# Patient Record
Sex: Male | Born: 1940 | Race: White | Hispanic: No | Marital: Married | Smoking: Never smoker
Health system: Southern US, Community
[De-identification: ages and names within clinical notes are randomized; demographics above are authoritative.]

## PROBLEM LIST (undated history)

## (undated) DIAGNOSIS — C9590 Leukemia, unspecified not having achieved remission: Secondary | ICD-10-CM

## (undated) DIAGNOSIS — K219 Gastro-esophageal reflux disease without esophagitis: Secondary | ICD-10-CM

## (undated) DIAGNOSIS — R002 Palpitations: Secondary | ICD-10-CM

## (undated) DIAGNOSIS — N289 Disorder of kidney and ureter, unspecified: Secondary | ICD-10-CM

## (undated) DIAGNOSIS — M359 Systemic involvement of connective tissue, unspecified: Secondary | ICD-10-CM

## (undated) DIAGNOSIS — I10 Essential (primary) hypertension: Secondary | ICD-10-CM

## (undated) DIAGNOSIS — E119 Type 2 diabetes mellitus without complications: Secondary | ICD-10-CM

## (undated) DIAGNOSIS — R079 Chest pain, unspecified: Secondary | ICD-10-CM

## (undated) DIAGNOSIS — J45909 Unspecified asthma, uncomplicated: Secondary | ICD-10-CM

## (undated) DIAGNOSIS — R0600 Dyspnea, unspecified: Secondary | ICD-10-CM

## (undated) DIAGNOSIS — T8859XA Other complications of anesthesia, initial encounter: Secondary | ICD-10-CM

## (undated) DIAGNOSIS — I2510 Atherosclerotic heart disease of native coronary artery without angina pectoris: Secondary | ICD-10-CM

## (undated) DIAGNOSIS — T4145XA Adverse effect of unspecified anesthetic, initial encounter: Secondary | ICD-10-CM

## (undated) DIAGNOSIS — J449 Chronic obstructive pulmonary disease, unspecified: Secondary | ICD-10-CM

## (undated) HISTORY — DX: Gastro-esophageal reflux disease without esophagitis: K21.9

## (undated) HISTORY — DX: Palpitations: R00.2

## (undated) HISTORY — DX: Systemic involvement of connective tissue, unspecified: M35.9

## (undated) HISTORY — DX: Dyspnea, unspecified: R06.00

## (undated) HISTORY — DX: Atherosclerotic heart disease of native coronary artery without angina pectoris: I25.10

## (undated) HISTORY — PX: CATARACT EXTRACTION: SUR2

## (undated) HISTORY — PX: CARDIAC CATHETERIZATION: SHX172

## (undated) HISTORY — DX: Type 2 diabetes mellitus without complications: E11.9

## (undated) HISTORY — DX: Chest pain, unspecified: R07.9

---

## 1969-10-13 HISTORY — PX: TONSILLECTOMY: SUR1361

## 1985-10-13 HISTORY — PX: APPENDECTOMY: SHX54

## 1987-10-14 HISTORY — PX: GALLBLADDER SURGERY: SHX652

## 1987-11-21 HISTORY — PX: NASAL SINUS SURGERY: SHX719

## 1988-10-13 HISTORY — PX: OTHER SURGICAL HISTORY: SHX169

## 2004-06-07 HISTORY — PX: OTHER SURGICAL HISTORY: SHX169

## 2005-07-21 ENCOUNTER — Inpatient Hospital Stay (HOSPITAL_COMMUNITY): Admission: RE | Admit: 2005-07-21 | Discharge: 2005-07-22 | Payer: Self-pay | Admitting: Neurosurgery

## 2005-10-13 HISTORY — PX: SHOULDER SURGERY: SHX246

## 2006-06-26 ENCOUNTER — Ambulatory Visit (HOSPITAL_COMMUNITY): Admission: RE | Admit: 2006-06-26 | Discharge: 2006-06-26 | Payer: Self-pay | Admitting: Orthopedic Surgery

## 2018-03-12 ENCOUNTER — Encounter: Payer: Self-pay | Admitting: *Deleted

## 2018-03-15 ENCOUNTER — Ambulatory Visit: Payer: Medicare Other | Admitting: Cardiovascular Disease

## 2018-03-15 ENCOUNTER — Encounter: Payer: Self-pay | Admitting: Cardiovascular Disease

## 2018-03-15 ENCOUNTER — Telehealth: Payer: Self-pay | Admitting: Cardiovascular Disease

## 2018-03-15 ENCOUNTER — Encounter: Payer: Self-pay | Admitting: *Deleted

## 2018-03-15 VITALS — BP 130/64 | HR 82 | Ht 67.0 in | Wt 173.0 lb

## 2018-03-15 DIAGNOSIS — K219 Gastro-esophageal reflux disease without esophagitis: Secondary | ICD-10-CM

## 2018-03-15 DIAGNOSIS — I959 Hypotension, unspecified: Secondary | ICD-10-CM | POA: Diagnosis not present

## 2018-03-15 DIAGNOSIS — I25118 Atherosclerotic heart disease of native coronary artery with other forms of angina pectoris: Secondary | ICD-10-CM | POA: Diagnosis not present

## 2018-03-15 DIAGNOSIS — I10 Essential (primary) hypertension: Secondary | ICD-10-CM

## 2018-03-15 DIAGNOSIS — E785 Hyperlipidemia, unspecified: Secondary | ICD-10-CM | POA: Diagnosis not present

## 2018-03-15 DIAGNOSIS — R011 Cardiac murmur, unspecified: Secondary | ICD-10-CM

## 2018-03-15 NOTE — Telephone Encounter (Signed)
*    Echo - cardiac murmur scheduled in [facility] April 07, 2018   *Exercise myoview -hypotension scheduled at [facility] April 08, 2018

## 2018-03-15 NOTE — Progress Notes (Signed)
CARDIOLOGY CONSULT NOTE  Patient ID: BRIAR WITHERSPOON MRN: [id number] DOB/AGE: 75/13/1942 75 y.o.  Admit date: (Not on file) Primary Physician: Caryl Bis, MD Referring Physician: Caryl Bis, MD  Reason for Consultation: Coronary artery disease  HPI: Lonnie Duffy is a 77 y.o. male who is being seen today for the evaluation of coronary artery disease at the request of Dr. Gar Ponto.   He has a history of coronary artery disease and [employer] Taxus drug-eluting stent placement to the LAD on 06/07/2004.  Other medical history includes GERD, dyslipidemia, rheumatoid arthritis, hypertension, and chronic reactive airway disease.  He has been followed in [place], [place].  I reviewed records from his PCP.  Back in June 2017, he reportedly wore a 24-hour Holter monitor for evaluation of palpitations.  There was evidence of infrequent supraventricular and ventricular ectopic activity with no evidence of SVT.  He underwent an echocardiogram at that time which demonstrated normal left ventricular systolic function, LVEF 09%, with LVH and minimal mitral and tricuspid regurgitation.  I personally reviewed an ECG performed on 04/01/2017 which demonstrated sinus rhythm with nonspecific ST segment abnormalities in the inferolateral leads.  I personally reviewed an ECG performed in our office today which also demonstrates sinus rhythm with nonspecific ST segment and T wave abnormalities in the inferolateral leads.  I reviewed labs performed on 02/25/2018: Hemoglobin 12.9, platelets 328, BUN 15, creatinine 0.99, A1c 5.9%, total cholesterol 132, triglycerides 80, HDL 47, LDL 69.  He said he has never used nitroglycerin.  He had symptoms of chest pain and indigestion prior to undergoing LAD stent placement.  Since that time he denies chest pain and shortness of breath.  He denies palpitations, orthopnea, and leg swelling.  He has noticed that if he heavily exerts himself  outdoors on hot days, his blood pressure drops to the 80/40 range.  He initially thought it was due to low blood sugar but has checked it at times and found to be normal.  He denies chest pain and dyspnea during these episodes.  He also has occasional dizzy spells when climbing up on a ladder and tries to work on the ground.  He works with his son.  He had been on Plavix in the past and it exacerbated GERD symptoms.       Allergies  Allergen Reactions  . Flagyl [Metronidazole]   . Penicillins     Current Outpatient Medications  Medication Sig Dispense Refill  . aspirin EC 81 MG tablet Take 81 mg by mouth daily.    Marland Kitchen atorvastatin (LIPITOR) 10 MG tablet Take 10 mg by mouth daily.    . Calcium-Magnesium-Vitamin D (CALCIUM 500 PO) Take 1 tablet by mouth 2 (two) times daily.    . Cholecalciferol (VITAMIN D3) 5000 units TABS Take 1 tablet by mouth daily.    . cyclobenzaprine (FLEXERIL) 10 MG tablet Take 10 mg by mouth daily as needed for muscle spasms.    . fluticasone (FLONASE) 50 MCG/ACT nasal spray Place 2 sprays into both nostrils daily.    . folic acid (FOLVITE) 1 MG tablet Take 1 mg by mouth daily.    Marland Kitchen gabapentin (NEURONTIN) 800 MG tablet Take 800 mg by mouth 2 (two) times daily.    Marland Kitchen losartan-hydrochlorothiazide (HYZAAR) 100-12.5 MG tablet Take 0.5 tablets by mouth daily.    . methotrexate 2.5 MG tablet Take 20 mg by mouth once a week. (8 tabs)    . nitroGLYCERIN (NITROSTAT) 0.4  MG SL tablet Place 0.4 mg under the tongue every 5 (five) minutes as needed for chest pain.    . Omega-3 Fatty Acids (FISH OIL) 1000 MG CAPS Take 4 capsules by mouth daily.    . pantoprazole (PROTONIX) 40 MG tablet Take 40 mg by mouth daily.    . polyethylene glycol (MIRALAX / GLYCOLAX) packet Take 17 g by mouth daily.    . tamsulosin (FLOMAX) 0.4 MG CAPS capsule Take 0.4 mg by mouth daily.    . valACYclovir (VALTREX) 500 MG tablet Take 500 mg by mouth.     No current facility-administered medications  for this visit.     Past Medical History:  Diagnosis Date  . ASCVD (arteriosclerotic cardiovascular disease)   . Chest pain   . 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 Diabetes mellitus without complication ([place])   . Dyspnea   . GERD (gastroesophageal reflux disease)   . Palpitations     Past Surgical History:  Procedure Laterality Date  . APPENDECTOMY  1987  . CARDIAC CATHETERIZATION    . CATARACT EXTRACTION  11/2009 & 12/2015  . GALLBLADDER SURGERY  1989  . NASAL SINUS SURGERY  11/21/1987  . SHOULDER SURGERY  2007  . stent in lad  06/07/2004  . TONSILLECTOMY  1971  . trigerfinger  1990    Social History   Socioeconomic History  . Marital status: Married    Spouse name: Not on file  . Number of children: Not on file  . Years of education: Not on file  . Highest education level: Not on file  Occupational History  . Not on file  Social Needs  . Financial resource strain: Not on file  . Food insecurity:    Worry: Not on file    Inability: Not on file  . Transportation needs:    Medical: Not on file    Non-medical: Not on file  Tobacco Use  . Smoking status: Never Smoker  . Smokeless tobacco: Never Used  Substance and Sexual Activity  . Alcohol use: Not on file  . Drug use: Not on file  . Sexual activity: Not on file  Lifestyle  . Physical activity:    Days per week: Not on file    Minutes per session: Not on file  . Stress: Not on file  Relationships  . Social connections:    Talks on phone: Not on file    Gets together: Not on file    Attends religious service: Not on file    Active member of club or organization: Not on file    Attends meetings of clubs or organizations: Not on file    Relationship status: Not on file  . Intimate partner violence:    Fear of current or ex partner: Not on file    Emotionally abused: Not on file    Physically abused: Not on file    Forced sexual activity: Not on file  Other Topics Concern  . Not on file  Social History  Narrative  . Not on file     No family history of premature CAD in 1st degree relatives.  Current Meds  Medication Sig  . aspirin EC 81 MG tablet Take 81 mg by mouth daily.  Marland Kitchen atorvastatin (LIPITOR) 10 MG tablet Take 10 mg by mouth daily.  . Calcium-Magnesium-Vitamin D (CALCIUM 500 PO) Take 1 tablet by mouth 2 (two) times daily.  . Cholecalciferol (VITAMIN D3) 5000 units TABS Take 1 tablet by mouth daily.  Marland Kitchen  cyclobenzaprine (FLEXERIL) 10 MG tablet Take 10 mg by mouth daily as needed for muscle spasms.  . fluticasone (FLONASE) 50 MCG/ACT nasal spray Place 2 sprays into both nostrils daily.  . folic acid (FOLVITE) 1 MG tablet Take 1 mg by mouth daily.  Marland Kitchen gabapentin (NEURONTIN) 800 MG tablet Take 800 mg by mouth 2 (two) times daily.  Marland Kitchen losartan-hydrochlorothiazide (HYZAAR) 100-12.5 MG tablet Take 0.5 tablets by mouth daily.  . methotrexate 2.5 MG tablet Take 20 mg by mouth once a week. (8 tabs)  . nitroGLYCERIN (NITROSTAT) 0.4 MG SL tablet Place 0.4 mg under the tongue every 5 (five) minutes as needed for chest pain.  . Omega-3 Fatty Acids (FISH OIL) 1000 MG CAPS Take 4 capsules by mouth daily.  . pantoprazole (PROTONIX) 40 MG tablet Take 40 mg by mouth daily.  . polyethylene glycol (MIRALAX / GLYCOLAX) packet Take 17 g by mouth daily.  . tamsulosin (FLOMAX) 0.4 MG CAPS capsule Take 0.4 mg by mouth daily.  . valACYclovir (VALTREX) 500 MG tablet Take 500 mg by mouth.      Review of systems complete and found to be negative unless listed above in HPI    Physical exam Blood pressure 130/64, pulse 82, height 5\' 7"  (1.702 m), weight 173 lb (78.5 kg), SpO2 97 %. General: NAD Neck: No JVD, no thyromegaly or thyroid nodule.  Lungs: Clear to auscultation bilaterally with normal respiratory effort. CV: Nondisplaced PMI. Regular rate and rhythm, normal S1/S2, no Z6/X0, 2/6 systolic murmur over right upper sternal border.  No peripheral edema.  No carotid bruit.   Abdomen: Soft, nontender, no  distention.  Skin: Intact without lesions or rashes.  Neurologic: Alert and oriented x 3.  Psych: Normal affect. Extremities: No clubbing or cyanosis.  HEENT: Normal.   ECG: Most recent ECG reviewed.   Labs: No results found for: K, BUN, CREATININE, ALT, TSH, HGB   Lipids: No results found for: LDLCALC, LDLDIRECT, CHOL, TRIG, HDL      ASSESSMENT AND PLAN:   1.  Coronary artery disease with history of [employer] Taxus drug-eluting stent placement to the LAD on 06/07/2004 with symptomatic hypotension: Currently on aspirin and Lipitor.  Blood pressure is normal today.  I will obtain an exercise Myoview to evaluate for any potential areas of significant ischemia and to see if he develops hypotension with exercise.  2.  Dyslipidemia: Currently on Lipitor 10 mg.  Lipids reviewed above.  No changes to therapy.  3.  Hypertension: Blood pressure is normal.  No changes to therapy.  4.  Cardiac murmur: He may have some aortic valve sclerosis if not a mild degree of aortic stenosis.  I will obtain an echocardiogram to evaluate cardiac structure and function.  5.  GERD: Currently on Protonix 40 mg daily.   Disposition: Follow up in 3 months   Signed: Kate Sable, M.D., F.A.C.C.  03/15/2018, 9:06 AM

## 2018-03-15 NOTE — Patient Instructions (Signed)
Medication Instructions:  Continue all current medications.  Labwork: none  Testing/Procedures:  Your physician has requested that you have an echocardiogram. Echocardiography is a painless test that uses sound waves to create images of your heart. It provides your doctor with information about the size and shape of your heart and how well your heart's chambers and valves are working. This procedure takes approximately one hour. There are no restrictions for this procedure.  Your physician has requested that you have an exercise stress myoview. For further information please visit [email or web address]. Please follow instruction sheet, as given.  Office will contact with results via phone or letter.    Follow-Up: 3 months   Any Other Special Instructions Will Be Listed Below (If Applicable).  If you need a refill on your cardiac medications before your next appointment, please call your pharmacy.  

## 2018-03-24 ENCOUNTER — Other Ambulatory Visit: Payer: Self-pay | Admitting: Cardiovascular Disease

## 2018-03-24 MED ORDER — NITROGLYCERIN 0.4 MG SL SUBL: 0.4000 mg | SUBLINGUAL_TABLET | SUBLINGUAL | 3 refills | Status: DC | PRN

## 2018-03-24 NOTE — Telephone Encounter (Signed)
°*  STAT* If patient is at the pharmacy, call can be transferred to refill team.   1. Which medications need to be refilled?  nitroGLYCERIN (NITROSTAT) 0.4 MG SL tablet   2. Which pharmacy/location (including street and city if local pharmacy) is medication to be sent to?  [facility] in [place], [place]   3. Do they need a 30 day or 90 day supply?

## 2018-03-24 NOTE — Telephone Encounter (Signed)
RX sent

## 2018-04-07 ENCOUNTER — Ambulatory Visit (INDEPENDENT_AMBULATORY_CARE_PROVIDER_SITE_OTHER): Payer: Medicare Other

## 2018-04-07 ENCOUNTER — Other Ambulatory Visit: Payer: Self-pay

## 2018-04-07 DIAGNOSIS — R011 Cardiac murmur, unspecified: Secondary | ICD-10-CM | POA: Diagnosis not present

## 2018-04-08 ENCOUNTER — Telehealth: Payer: Self-pay | Admitting: *Deleted

## 2018-04-08 ENCOUNTER — Encounter (HOSPITAL_BASED_OUTPATIENT_CLINIC_OR_DEPARTMENT_OTHER)
Admission: RE | Admit: 2018-04-08 | Discharge: 2018-04-08 | Disposition: A | Discharge status: Home / Self Care | Payer: Medicare Other | Source: Ambulatory Visit | Attending: Cardiovascular Disease | Admitting: Cardiovascular Disease

## 2018-04-08 ENCOUNTER — Encounter (HOSPITAL_COMMUNITY): Payer: Self-pay

## 2018-04-08 ENCOUNTER — Encounter (HOSPITAL_COMMUNITY)
Admission: RE | Admit: 2018-04-08 | Discharge: 2018-04-08 | Disposition: A | Discharge status: Home / Self Care | Payer: Medicare Other | Source: Ambulatory Visit | Attending: Cardiovascular Disease | Admitting: Cardiovascular Disease

## 2018-04-08 DIAGNOSIS — I959 Hypotension, unspecified: Secondary | ICD-10-CM

## 2018-04-08 DIAGNOSIS — R0789 Other chest pain: Secondary | ICD-10-CM | POA: Diagnosis not present

## 2018-04-08 HISTORY — DX: Systemic involvement of connective tissue, unspecified: M35.9

## 2018-04-08 LAB — NM MYOCAR MULTI W/SPECT W/WALL MOTION / EF
Estimated workload: 7 METS
Exercise duration (min): 6 min
Exercise duration (sec): 1 s
LV dias vol: 67 mL (ref 62–150)
LV sys vol: 23 mL
MPHR: 146 {beats}/min
Peak HR: 131 {beats}/min
Percent HR: 89 %
RATE: 0.34
RPE: 13
Rest HR: 81 {beats}/min
SDS: 1
SRS: 0
SSS: 1
TID: 0.97

## 2018-04-08 MED ORDER — TECHNETIUM TC 99M TETROFOSMIN IV KIT
30.0000 | PACK | Freq: Once | INTRAVENOUS | Status: AC | PRN
  Administered 2018-04-08: 30 via INTRAVENOUS

## 2018-04-08 MED ORDER — SODIUM CHLORIDE 0.9% FLUSH
INTRAVENOUS | Status: DC
Start: 2018-04-08 — End: 2018-04-08
  Filled 2018-04-08: qty 160

## 2018-04-08 MED ORDER — TECHNETIUM TC 99M TETROFOSMIN IV KIT
10.0000 | PACK | Freq: Once | INTRAVENOUS | Status: AC | PRN
  Administered 2018-04-08: 9.7 via INTRAVENOUS

## 2018-04-08 MED ORDER — REGADENOSON 0.4 MG/5ML IV SOLN
INTRAVENOUS | Status: AC
  Filled 2018-04-08: qty 5

## 2018-04-08 MED ORDER — SODIUM CHLORIDE 0.9% FLUSH
INTRAVENOUS | Status: AC
  Administered 2018-04-08: 10 mL via INTRAVENOUS
  Filled 2018-04-08: qty 10

## 2018-04-08 NOTE — Telephone Encounter (Signed)
Notes recorded by Laurine Blazer, LPN on 3/46/2194 at 7:12 PM EDT Patient notified. Copy to pmd. Follow up scheduled for September with Dr. Bronson Ing.   ------  Notes recorded by Herminio Commons, MD on 04/08/2018 at 8:32 AM EDT Normal pumping function.

## 2018-06-21 ENCOUNTER — Other Ambulatory Visit: Payer: Self-pay | Admitting: Cardiovascular Disease

## 2018-06-21 ENCOUNTER — Encounter: Payer: Self-pay | Admitting: *Deleted

## 2018-06-21 ENCOUNTER — Encounter: Payer: Self-pay | Admitting: Cardiovascular Disease

## 2018-06-21 ENCOUNTER — Ambulatory Visit: Payer: Medicare Other | Admitting: Cardiovascular Disease

## 2018-06-21 ENCOUNTER — Telehealth: Payer: Self-pay | Admitting: Cardiovascular Disease

## 2018-06-21 VITALS — BP 112/58 | HR 83 | Ht 67.0 in | Wt 172.0 lb

## 2018-06-21 DIAGNOSIS — R9439 Abnormal result of other cardiovascular function study: Secondary | ICD-10-CM

## 2018-06-21 DIAGNOSIS — I25118 Atherosclerotic heart disease of native coronary artery with other forms of angina pectoris: Secondary | ICD-10-CM

## 2018-06-21 DIAGNOSIS — R0609 Other forms of dyspnea: Secondary | ICD-10-CM

## 2018-06-21 DIAGNOSIS — I959 Hypotension, unspecified: Secondary | ICD-10-CM

## 2018-06-21 DIAGNOSIS — I10 Essential (primary) hypertension: Secondary | ICD-10-CM

## 2018-06-21 DIAGNOSIS — R011 Cardiac murmur, unspecified: Secondary | ICD-10-CM

## 2018-06-21 DIAGNOSIS — K219 Gastro-esophageal reflux disease without esophagitis: Secondary | ICD-10-CM

## 2018-06-21 DIAGNOSIS — E785 Hyperlipidemia, unspecified: Secondary | ICD-10-CM

## 2018-06-21 DIAGNOSIS — Z01812 Encounter for preprocedural laboratory examination: Secondary | ICD-10-CM

## 2018-06-21 NOTE — Addendum Note (Signed)
Addended by: Laurine Blazer on: 06/21/2018 10:34 AM   Modules accepted: Orders

## 2018-06-21 NOTE — Telephone Encounter (Signed)
°  Precert needed for: Right & Left Heart Cath - Monday, 9/196 - End - 9:00    Location:     Date: 9/16

## 2018-06-21 NOTE — H&P (View-Only) (Signed)
SUBJECTIVE: The patient returns for follow-up after undergoing cardiovascular testing performed for the evaluation of hypotension and cardiac murmur. He has a history of coronary artery disease and [employer] Taxus drug-eluting stent placement to the LAD on 06/07/2004. Other medical history includes GERD, dyslipidemia, rheumatoid arthritis, hypertension, and chronic reactive airway disease.    With stress testing, there were T wave inversions in 2, 3, and aVF and returning to baseline after 1 to 5 minutes of recovery.  There was downsloping ST segment depression of 1 mm in V4, V5, V6, 2, 3, and aVF also returning to baseline within a few minutes of recovery.  Myocardial perfusion was unremarkable.  Echocardiogram demonstrated normal left ventricular systolic function and regional wall motion, LVEF 55 to 60%.  There was no significant valvular regurgitation.  There was aortic and mitral annular calcification.  He used to be very active but now experiences premature fatigue and exertional dyspnea.  He normally digs ditches by himself, does plumbing work, does Dealer work, and all sorts of outdoor yard work.  He has been unable to keep up with his normal activities.  He continues to experience episodic low blood pressures associated with dizziness but denies syncope.     Review of Systems: As per "subjective", otherwise negative.  Allergies  Allergen Reactions  . Flagyl [Metronidazole]   . Penicillins     Current Outpatient Medications  Medication Sig Dispense Refill  . aspirin EC 81 MG tablet Take 81 mg by mouth daily.    Marland Kitchen atorvastatin (LIPITOR) 10 MG tablet Take 10 mg by mouth daily.    . Calcium-Magnesium-Vitamin D (CALCIUM 500 PO) Take 1 tablet by mouth 2 (two) times daily.    . Cholecalciferol (VITAMIN D3) 5000 units TABS Take 1 tablet by mouth daily.    . cyclobenzaprine (FLEXERIL) 10 MG tablet Take 10 mg by mouth daily as needed for muscle spasms.    . fluticasone  (FLONASE) 50 MCG/ACT nasal spray Place 2 sprays into both nostrils daily.    . folic acid (FOLVITE) 1 MG tablet Take 1 mg by mouth daily.    Marland Kitchen gabapentin (NEURONTIN) 600 MG tablet Take 600 mg by mouth 3 (three) times daily.    Marland Kitchen losartan-hydrochlorothiazide (HYZAAR) 100-12.5 MG tablet Take 1 tablet by mouth daily.     . methotrexate 2.5 MG tablet Take 20 mg by mouth once a week. (8 tabs)    . nitroGLYCERIN (NITROSTAT) 0.4 MG SL tablet Place 1 tablet (0.4 mg total) under the tongue every 5 (five) minutes as needed for chest pain. 25 tablet 3  . Omega-3 Fatty Acids (FISH OIL) 1000 MG CAPS Take 4 capsules by mouth daily.    . pantoprazole (PROTONIX) 40 MG tablet Take 40 mg by mouth daily.    . polyethylene glycol (MIRALAX / GLYCOLAX) packet Take 17 g by mouth daily.    . tamsulosin (FLOMAX) 0.4 MG CAPS capsule Take 0.4 mg by mouth daily.    . valACYclovir (VALTREX) 500 MG tablet Take 500 mg by mouth.     No current facility-administered medications for this visit.     Past Medical History:  Diagnosis Date  . ASCVD (arteriosclerotic cardiovascular disease)   . Chest pain   . Collagen vascular disease ([place])   . 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 Diabetes mellitus without complication ([place])   . Dyspnea   . GERD (gastroesophageal reflux disease)   . Palpitations     Past Surgical History:  Procedure Laterality Date  . APPENDECTOMY  1987  . CARDIAC CATHETERIZATION    . CATARACT EXTRACTION  11/2009 & 12/2015  . GALLBLADDER SURGERY  1989  . NASAL SINUS SURGERY  11/21/1987  . SHOULDER SURGERY  2007  . stent in lad  06/07/2004  . TONSILLECTOMY  1971  . trigerfinger  1990    Social History   Socioeconomic History  . Marital status: Married    Spouse name: Not on file  . Number of children: Not on file  . Years of education: Not on file  . Highest education level: Not on file  Occupational History  . Not on file  Social Needs  . Financial resource strain: Not on file  . Food  insecurity:    Worry: Not on file    Inability: Not on file  . Transportation needs:    Medical: Not on file    Non-medical: Not on file  Tobacco Use  . Smoking status: Never Smoker  . Smokeless tobacco: Never Used  Substance and Sexual Activity  . Alcohol use: Not on file  . Drug use: Not on file  . Sexual activity: Not on file  Lifestyle  . Physical activity:    Days per week: Not on file    Minutes per session: Not on file  . Stress: Not on file  Relationships  . Social connections:    Talks on phone: Not on file    Gets together: Not on file    Attends religious service: Not on file    Active member of club or organization: Not on file    Attends meetings of clubs or organizations: Not on file    Relationship status: Not on file  . Intimate partner violence:    Fear of current or ex partner: Not on file    Emotionally abused: Not on file    Physically abused: Not on file    Forced sexual activity: Not on file  Other Topics Concern  . Not on file  Social History Narrative  . Not on file     Vitals:   06/21/18 0952  BP: (!) 112/58  Pulse: 83  SpO2: 98%  Weight: 172 lb (78 kg)  Height: 5\' 7"  (1.702 m)    Wt Readings from Last 3 Encounters:  06/21/18 172 lb (78 kg)  03/15/18 173 lb (78.5 kg)     PHYSICAL EXAM General: NAD HEENT: Normal. Neck: No JVD, no thyromegaly. Lungs: Clear to auscultation bilaterally with normal respiratory effort. CV: Regular rate and rhythm, normal S1/S2, no G8/Q7, 2/6 systolic murmur over right upper sternal border . No pretibial or periankle edema.  No carotid bruit.   Abdomen: Soft, nontender, no distention.  Neurologic: Alert and oriented.  Psych: Normal affect. Skin: Normal. Musculoskeletal: No gross deformities.    ECG: Reviewed above under Subjective   Labs: No results found for: K, BUN, CREATININE, ALT, TSH, HGB   Lipids: No results found for: LDLCALC, LDLDIRECT, CHOL, TRIG, HDL     ASSESSMENT AND  PLAN:  1.  Coronary artery disease with history of [employer] Taxus drug-eluting stent placement to the LAD on 06/07/2004 with symptomatic hypotension and progressive exertional dyspnea: Currently on aspirin and Lipitor.  Blood pressure is normal today.  Stress test results reviewed above with ECG abnormalities during stress with low risk myocardial perfusion. In order to determine that in-stent restenosis is not the culprit (ie symptoms are not indicative of atypical angina), I will arrange for right and  left heart catheterization and coronary angiography. Risks and benefits of cardiac catheterization have been discussed with the patient.  These include bleeding, infection, kidney damage, stroke, heart attack, death.  The patient understands these risks and is willing to proceed.  2.  Dyslipidemia: Currently on Lipitor 10 mg.  Lipids previously reviewed.  No changes to therapy.  3.  Hypertension: Blood pressure is normal.  No changes to therapy.  4.  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  5.  GERD: Currently on Protonix 40 mg daily.   Disposition: Follow up after cath  A high level of decision making was required for increased medical complexities.    Kate Sable, M.D., F.A.C.C.

## 2018-06-21 NOTE — Progress Notes (Signed)
SUBJECTIVE: The patient returns for follow-up after undergoing cardiovascular testing performed for the evaluation of hypotension and cardiac murmur. He has a history of coronary artery disease and [employer] Taxus drug-eluting stent placement to the LAD on 06/07/2004. Other medical history includes GERD, dyslipidemia, rheumatoid arthritis, hypertension, and chronic reactive airway disease.    With stress testing, there were T wave inversions in 2, 3, and aVF and returning to baseline after 1 to 5 minutes of recovery.  There was downsloping ST segment depression of 1 mm in V4, V5, V6, 2, 3, and aVF also returning to baseline within a few minutes of recovery.  Myocardial perfusion was unremarkable.  Echocardiogram demonstrated normal left ventricular systolic function and regional wall motion, LVEF 55 to 60%.  There was no significant valvular regurgitation.  There was aortic and mitral annular calcification.  He used to be very active but now experiences premature fatigue and exertional dyspnea.  He normally digs ditches by himself, does plumbing work, does Dealer work, and all sorts of outdoor yard work.  He has been unable to keep up with his normal activities.  He continues to experience episodic low blood pressures associated with dizziness but denies syncope.     Review of Systems: As per "subjective", otherwise negative.  Allergies  Allergen Reactions  . Flagyl [Metronidazole]   . Penicillins     Current Outpatient Medications  Medication Sig Dispense Refill  . aspirin EC 81 MG tablet Take 81 mg by mouth daily.    Marland Kitchen atorvastatin (LIPITOR) 10 MG tablet Take 10 mg by mouth daily.    . Calcium-Magnesium-Vitamin D (CALCIUM 500 PO) Take 1 tablet by mouth 2 (two) times daily.    . Cholecalciferol (VITAMIN D3) 5000 units TABS Take 1 tablet by mouth daily.    . cyclobenzaprine (FLEXERIL) 10 MG tablet Take 10 mg by mouth daily as needed for muscle spasms.    . fluticasone  (FLONASE) 50 MCG/ACT nasal spray Place 2 sprays into both nostrils daily.    . folic acid (FOLVITE) 1 MG tablet Take 1 mg by mouth daily.    Marland Kitchen gabapentin (NEURONTIN) 600 MG tablet Take 600 mg by mouth 3 (three) times daily.    Marland Kitchen losartan-hydrochlorothiazide (HYZAAR) 100-12.5 MG tablet Take 1 tablet by mouth daily.     . methotrexate 2.5 MG tablet Take 20 mg by mouth once a week. (8 tabs)    . nitroGLYCERIN (NITROSTAT) 0.4 MG SL tablet Place 1 tablet (0.4 mg total) under the tongue every 5 (five) minutes as needed for chest pain. 25 tablet 3  . Omega-3 Fatty Acids (FISH OIL) 1000 MG CAPS Take 4 capsules by mouth daily.    . pantoprazole (PROTONIX) 40 MG tablet Take 40 mg by mouth daily.    . polyethylene glycol (MIRALAX / GLYCOLAX) packet Take 17 g by mouth daily.    . tamsulosin (FLOMAX) 0.4 MG CAPS capsule Take 0.4 mg by mouth daily.    . valACYclovir (VALTREX) 500 MG tablet Take 500 mg by mouth.     No current facility-administered medications for this visit.     Past Medical History:  Diagnosis Date  . ASCVD (arteriosclerotic cardiovascular disease)   . Chest pain   . Collagen vascular disease ([place])   . 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 Diabetes mellitus without complication ([place])   . Dyspnea   . GERD (gastroesophageal reflux disease)   . Palpitations     Past Surgical History:  Procedure Laterality Date  . APPENDECTOMY  1987  . CARDIAC CATHETERIZATION    . CATARACT EXTRACTION  11/2009 & 12/2015  . GALLBLADDER SURGERY  1989  . NASAL SINUS SURGERY  11/21/1987  . SHOULDER SURGERY  2007  . stent in lad  06/07/2004  . TONSILLECTOMY  1971  . trigerfinger  1990    Social History   Socioeconomic History  . Marital status: Married    Spouse name: Not on file  . Number of children: Not on file  . Years of education: Not on file  . Highest education level: Not on file  Occupational History  . Not on file  Social Needs  . Financial resource strain: Not on file  . Food  insecurity:    Worry: Not on file    Inability: Not on file  . Transportation needs:    Medical: Not on file    Non-medical: Not on file  Tobacco Use  . Smoking status: Never Smoker  . Smokeless tobacco: Never Used  Substance and Sexual Activity  . Alcohol use: Not on file  . Drug use: Not on file  . Sexual activity: Not on file  Lifestyle  . Physical activity:    Days per week: Not on file    Minutes per session: Not on file  . Stress: Not on file  Relationships  . Social connections:    Talks on phone: Not on file    Gets together: Not on file    Attends religious service: Not on file    Active member of club or organization: Not on file    Attends meetings of clubs or organizations: Not on file    Relationship status: Not on file  . Intimate partner violence:    Fear of current or ex partner: Not on file    Emotionally abused: Not on file    Physically abused: Not on file    Forced sexual activity: Not on file  Other Topics Concern  . Not on file  Social History Narrative  . Not on file     Vitals:   06/21/18 0952  BP: (!) 112/58  Pulse: 83  SpO2: 98%  Weight: 172 lb (78 kg)  Height: 5\' 7"  (1.702 m)    Wt Readings from Last 3 Encounters:  06/21/18 172 lb (78 kg)  03/15/18 173 lb (78.5 kg)     PHYSICAL EXAM General: NAD HEENT: Normal. Neck: No JVD, no thyromegaly. Lungs: Clear to auscultation bilaterally with normal respiratory effort. CV: Regular rate and rhythm, normal S1/S2, no O2/V0, 2/6 systolic murmur over right upper sternal border . No pretibial or periankle edema.  No carotid bruit.   Abdomen: Soft, nontender, no distention.  Neurologic: Alert and oriented.  Psych: Normal affect. Skin: Normal. Musculoskeletal: No gross deformities.    ECG: Reviewed above under Subjective   Labs: No results found for: K, BUN, CREATININE, ALT, TSH, HGB   Lipids: No results found for: LDLCALC, LDLDIRECT, CHOL, TRIG, HDL     ASSESSMENT AND  PLAN:  1.  Coronary artery disease with history of [employer] Taxus drug-eluting stent placement to the LAD on 06/07/2004 with symptomatic hypotension and progressive exertional dyspnea: Currently on aspirin and Lipitor.  Blood pressure is normal today.  Stress test results reviewed above with ECG abnormalities during stress with low risk myocardial perfusion. In order to determine that in-stent restenosis is not the culprit (ie symptoms are not indicative of atypical angina), I will arrange for right and  left heart catheterization and coronary angiography. Risks and benefits of cardiac catheterization have been discussed with the patient.  These include bleeding, infection, kidney damage, stroke, heart attack, death.  The patient understands these risks and is willing to proceed.  2.  Dyslipidemia: Currently on Lipitor 10 mg.  Lipids previously reviewed.  No changes to therapy.  3.  Hypertension: Blood pressure is normal.  No changes to therapy.  4.  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  5.  GERD: Currently on Protonix 40 mg daily.   Disposition: Follow up after cath  A high level of decision making was required for increased medical complexities.    Kate Sable, M.D., F.A.C.C.

## 2018-06-21 NOTE — Patient Instructions (Signed)
Medication Instructions:  Continue all current medications.  Labwork: BMET, CBC - orders given today   Testing/Procedures: Your physician has requested that you have a cardiac catheterization. Cardiac catheterization is used to diagnose and/or treat various heart conditions. Doctors may recommend this procedure for a number of different reasons. The most common reason is to evaluate chest pain. Chest pain can be a symptom of coronary artery disease (CAD), and cardiac catheterization can show whether plaque is narrowing or blocking your heart's arteries. This procedure is also used to evaluate the valves, as well as measure the blood flow and oxygen levels in different parts of your heart. For further information please visit www.cardiosmart.org. Please follow instruction sheet, as given.   Follow-Up: 1 month   Any Other Special Instructions Will Be Listed Below (If Applicable).   If you need a refill on your cardiac medications before your next appointment, please call your pharmacy.  

## 2018-06-24 ENCOUNTER — Encounter: Payer: Self-pay | Admitting: *Deleted

## 2018-06-28 ENCOUNTER — Encounter (HOSPITAL_COMMUNITY): Payer: Self-pay | Admitting: General Practice

## 2018-06-28 ENCOUNTER — Ambulatory Visit (HOSPITAL_COMMUNITY)
Admission: RE | Admit: 2018-06-28 | Discharge: 2018-06-29 | Disposition: A | Discharge status: Home / Self Care | Payer: Medicare Other | Source: Ambulatory Visit | Attending: Internal Medicine | Admitting: Internal Medicine

## 2018-06-28 ENCOUNTER — Other Ambulatory Visit: Payer: Self-pay

## 2018-06-28 ENCOUNTER — Encounter (HOSPITAL_COMMUNITY)
Admission: RE | Disposition: A | Discharge status: Home / Self Care | Payer: Self-pay | Source: Ambulatory Visit | Attending: Internal Medicine

## 2018-06-28 DIAGNOSIS — K219 Gastro-esophageal reflux disease without esophagitis: Secondary | ICD-10-CM | POA: Insufficient documentation

## 2018-06-28 DIAGNOSIS — I959 Hypotension, unspecified: Secondary | ICD-10-CM | POA: Insufficient documentation

## 2018-06-28 DIAGNOSIS — I5030 Unspecified diastolic (congestive) heart failure: Secondary | ICD-10-CM | POA: Insufficient documentation

## 2018-06-28 DIAGNOSIS — Z7982 Long term (current) use of aspirin: Secondary | ICD-10-CM | POA: Insufficient documentation

## 2018-06-28 DIAGNOSIS — E119 Type 2 diabetes mellitus without complications: Secondary | ICD-10-CM | POA: Insufficient documentation

## 2018-06-28 DIAGNOSIS — I110 Hypertensive heart disease with heart failure: Secondary | ICD-10-CM | POA: Diagnosis not present

## 2018-06-28 DIAGNOSIS — Z7951 Long term (current) use of inhaled steroids: Secondary | ICD-10-CM | POA: Diagnosis not present

## 2018-06-28 DIAGNOSIS — M069 Rheumatoid arthritis, unspecified: Secondary | ICD-10-CM | POA: Insufficient documentation

## 2018-06-28 DIAGNOSIS — R0609 Other forms of dyspnea: Secondary | ICD-10-CM | POA: Diagnosis present

## 2018-06-28 DIAGNOSIS — T82855A Stenosis of coronary artery stent, initial encounter: Secondary | ICD-10-CM | POA: Insufficient documentation

## 2018-06-28 DIAGNOSIS — Z880 Allergy status to penicillin: Secondary | ICD-10-CM | POA: Insufficient documentation

## 2018-06-28 DIAGNOSIS — I10 Essential (primary) hypertension: Secondary | ICD-10-CM

## 2018-06-28 DIAGNOSIS — J45909 Unspecified asthma, uncomplicated: Secondary | ICD-10-CM | POA: Insufficient documentation

## 2018-06-28 DIAGNOSIS — R9439 Abnormal result of other cardiovascular function study: Secondary | ICD-10-CM

## 2018-06-28 DIAGNOSIS — I2584 Coronary atherosclerosis due to calcified coronary lesion: Secondary | ICD-10-CM | POA: Insufficient documentation

## 2018-06-28 DIAGNOSIS — Z955 Presence of coronary angioplasty implant and graft: Secondary | ICD-10-CM

## 2018-06-28 DIAGNOSIS — I25119 Atherosclerotic heart disease of native coronary artery with unspecified angina pectoris: Secondary | ICD-10-CM | POA: Diagnosis present

## 2018-06-28 DIAGNOSIS — I25110 Atherosclerotic heart disease of native coronary artery with unstable angina pectoris: Secondary | ICD-10-CM | POA: Insufficient documentation

## 2018-06-28 DIAGNOSIS — E785 Hyperlipidemia, unspecified: Secondary | ICD-10-CM | POA: Diagnosis not present

## 2018-06-28 DIAGNOSIS — R011 Cardiac murmur, unspecified: Secondary | ICD-10-CM | POA: Insufficient documentation

## 2018-06-28 DIAGNOSIS — I2510 Atherosclerotic heart disease of native coronary artery without angina pectoris: Secondary | ICD-10-CM | POA: Diagnosis present

## 2018-06-28 DIAGNOSIS — Y831 Surgical operation with implant of artificial internal device as the cause of abnormal reaction of the patient, or of later complication, without mention of misadventure at the time of the procedure: Secondary | ICD-10-CM | POA: Insufficient documentation

## 2018-06-28 HISTORY — DX: Atherosclerotic heart disease of native coronary artery without angina pectoris: I25.10

## 2018-06-28 HISTORY — PX: INTRAVASCULAR ULTRASOUND/IVUS: CATH118244

## 2018-06-28 HISTORY — PX: CORONARY STENT INTERVENTION: CATH118234

## 2018-06-28 HISTORY — PX: RIGHT/LEFT HEART CATH AND CORONARY ANGIOGRAPHY: CATH118266

## 2018-06-28 HISTORY — DX: Other complications of anesthesia, initial encounter: T88.59XA

## 2018-06-28 HISTORY — DX: Essential (primary) hypertension: I10

## 2018-06-28 HISTORY — DX: Adverse effect of unspecified anesthetic, initial encounter: T41.45XA

## 2018-06-28 LAB — POCT I-STAT 3, ART BLOOD GAS (G3+)
Acid-Base Excess: 3 mmol/L — ABNORMAL HIGH (ref 0.0–2.0)
BICARBONATE: 29.1 mmol/L — AB (ref 20.0–28.0)
O2 Saturation: 99 %
PH ART: 7.391 (ref 7.350–7.450)
PO2 ART: 150 mmHg — AB (ref 83.0–108.0)
TCO2: 31 mmol/L (ref 22–32)
pCO2 arterial: 48 mmHg (ref 32.0–48.0)

## 2018-06-28 LAB — POCT I-STAT 3, VENOUS BLOOD GAS (G3P V)
Acid-Base Excess: 3 mmol/L — ABNORMAL HIGH (ref 0.0–2.0)
BICARBONATE: 28.9 mmol/L — AB (ref 20.0–28.0)
O2 Saturation: 79 %
PCO2 VEN: 49.7 mmHg (ref 44.0–60.0)
PH VEN: 7.373 (ref 7.250–7.430)
PO2 VEN: 45 mmHg (ref 32.0–45.0)
TCO2: 30 mmol/L (ref 22–32)

## 2018-06-28 LAB — GLUCOSE, CAPILLARY
GLUCOSE-CAPILLARY: 94 mg/dL (ref 70–99)
GLUCOSE-CAPILLARY: 162 mg/dL — AB (ref 70–99)
GLUCOSE-CAPILLARY: 99 mg/dL (ref 70–99)

## 2018-06-28 LAB — POCT ACTIVATED CLOTTING TIME
ACTIVATED CLOTTING TIME: 279 s
ACTIVATED CLOTTING TIME: 252 s
ACTIVATED CLOTTING TIME: 285 s
Activated Clotting Time: 257 seconds

## 2018-06-28 SURGERY — RIGHT/LEFT HEART CATH AND CORONARY ANGIOGRAPHY
Anesthesia: LOCAL

## 2018-06-28 MED ORDER — HYDRALAZINE HCL 20 MG/ML IJ SOLN: 5.0000 mg | INTRAMUSCULAR | Status: AC | PRN

## 2018-06-28 MED ORDER — FENTANYL CITRATE (PF) 100 MCG/2ML IJ SOLN
INTRAMUSCULAR | Status: DC | PRN
  Administered 2018-06-28 (×4): 25 ug via INTRAVENOUS

## 2018-06-28 MED ORDER — NITROGLYCERIN 1 MG/10 ML FOR IR/CATH LAB
INTRA_ARTERIAL | Status: AC
  Filled 2018-06-28: qty 10

## 2018-06-28 MED ORDER — SODIUM CHLORIDE 0.9% FLUSH: 3.0000 mL | INTRAVENOUS | Status: DC | PRN

## 2018-06-28 MED ORDER — SODIUM CHLORIDE 0.9 % WEIGHT BASED INFUSION
3.0000 mL/kg/h | INTRAVENOUS | Status: DC
  Administered 2018-06-28: 3 mL/kg/h via INTRAVENOUS

## 2018-06-28 MED ORDER — FOLIC ACID 1 MG PO TABS
1.0000 mg | ORAL_TABLET | Freq: Every day | ORAL | Status: DC
  Administered 2018-06-29: 1 mg via ORAL
  Filled 2018-06-28: qty 1

## 2018-06-28 MED ORDER — NITROGLYCERIN 1 MG/10 ML FOR IR/CATH LAB
INTRA_ARTERIAL | Status: DC | PRN
  Administered 2018-06-28: 200 ug via INTRACORONARY

## 2018-06-28 MED ORDER — PANTOPRAZOLE SODIUM 40 MG PO TBEC
40.0000 mg | DELAYED_RELEASE_TABLET | Freq: Every day | ORAL | Status: DC
  Administered 2018-06-29: 40 mg via ORAL
  Filled 2018-06-28: qty 1

## 2018-06-28 MED ORDER — SODIUM CHLORIDE 0.9 % IV SOLN: 250.0000 mL | INTRAVENOUS | Status: DC | PRN

## 2018-06-28 MED ORDER — ANGIOPLASTY BOOK
Freq: Once | Status: AC
  Administered 2018-06-28: 22:00:00
  Filled 2018-06-28: qty 1

## 2018-06-28 MED ORDER — VERAPAMIL HCL 2.5 MG/ML IV SOLN
INTRAVENOUS | Status: DC | PRN
  Administered 2018-06-28: 10 mL via INTRA_ARTERIAL

## 2018-06-28 MED ORDER — ATORVASTATIN CALCIUM 10 MG PO TABS
10.0000 mg | ORAL_TABLET | Freq: Every day | ORAL | Status: DC
  Administered 2018-06-28: 10 mg via ORAL
  Filled 2018-06-28: qty 1

## 2018-06-28 MED ORDER — LOSARTAN POTASSIUM 50 MG PO TABS
100.0000 mg | ORAL_TABLET | Freq: Every day | ORAL | Status: DC
  Administered 2018-06-29: 100 mg via ORAL
  Filled 2018-06-28: qty 2

## 2018-06-28 MED ORDER — MIDAZOLAM HCL 2 MG/2ML IJ SOLN
INTRAMUSCULAR | Status: DC | PRN
  Administered 2018-06-28 (×2): 1 mg via INTRAVENOUS

## 2018-06-28 MED ORDER — HEPARIN (PORCINE) IN NACL 1000-0.9 UT/500ML-% IV SOLN
INTRAVENOUS | Status: DC | PRN
  Administered 2018-06-28: 500 mL

## 2018-06-28 MED ORDER — TAMSULOSIN HCL 0.4 MG PO CAPS
0.4000 mg | ORAL_CAPSULE | Freq: Every day | ORAL | Status: DC
  Administered 2018-06-28 – 2018-06-29 (×2): 0.4 mg via ORAL
  Filled 2018-06-28 (×2): qty 1

## 2018-06-28 MED ORDER — ASPIRIN EC 81 MG PO TBEC
81.0000 mg | DELAYED_RELEASE_TABLET | Freq: Every day | ORAL | Status: DC
  Administered 2018-06-29: 81 mg via ORAL
  Filled 2018-06-28: qty 1

## 2018-06-28 MED ORDER — HEPARIN SODIUM (PORCINE) 1000 UNIT/ML IJ SOLN
INTRAMUSCULAR | Status: AC
  Filled 2018-06-28: qty 1

## 2018-06-28 MED ORDER — FENTANYL CITRATE (PF) 100 MCG/2ML IJ SOLN
INTRAMUSCULAR | Status: AC
  Filled 2018-06-28: qty 2

## 2018-06-28 MED ORDER — HEPARIN SODIUM (PORCINE) 1000 UNIT/ML IJ SOLN
INTRAMUSCULAR | Status: DC | PRN
  Administered 2018-06-28: 2000 [IU] via INTRAVENOUS
  Administered 2018-06-28: 4000 [IU] via INTRAVENOUS
  Administered 2018-06-28: 2000 [IU] via INTRAVENOUS
  Administered 2018-06-28: 4000 [IU] via INTRAVENOUS

## 2018-06-28 MED ORDER — SODIUM CHLORIDE 0.9 % IV SOLN
INTRAVENOUS | Status: DC | PRN
  Administered 2018-06-28: 10 mL/h via INTRAVENOUS

## 2018-06-28 MED ORDER — SODIUM CHLORIDE 0.9 % WEIGHT BASED INFUSION
1.0000 mL/kg/h | INTRAVENOUS | Status: DC
  Administered 2018-06-28: 250 mL via INTRAVENOUS

## 2018-06-28 MED ORDER — SODIUM CHLORIDE 0.9% FLUSH: 3.0000 mL | Freq: Two times a day (BID) | INTRAVENOUS | Status: DC

## 2018-06-28 MED ORDER — MORPHINE SULFATE (PF) 10 MG/ML IV SOLN
INTRAVENOUS | Status: AC
  Filled 2018-06-28: qty 1

## 2018-06-28 MED ORDER — MIDAZOLAM HCL 2 MG/2ML IJ SOLN
INTRAMUSCULAR | Status: AC
  Filled 2018-06-28: qty 2

## 2018-06-28 MED ORDER — LIDOCAINE HCL (PF) 1 % IJ SOLN
INTRAMUSCULAR | Status: AC
  Filled 2018-06-28: qty 30

## 2018-06-28 MED ORDER — ACETAMINOPHEN 325 MG PO TABS
650.0000 mg | ORAL_TABLET | ORAL | Status: DC | PRN
  Administered 2018-06-28: 17:00:00 650 mg via ORAL
  Filled 2018-06-28: qty 2

## 2018-06-28 MED ORDER — ONDANSETRON HCL 4 MG/2ML IJ SOLN: 4.0000 mg | Freq: Four times a day (QID) | INTRAMUSCULAR | Status: DC | PRN

## 2018-06-28 MED ORDER — IOHEXOL 350 MG/ML SOLN
INTRAVENOUS | Status: DC | PRN
  Administered 2018-06-28: 120 mL via INTRAVENOUS

## 2018-06-28 MED ORDER — POLYETHYLENE GLYCOL 3350 17 G PO PACK: 17.0000 g | PACK | Freq: Every day | ORAL | Status: DC | PRN

## 2018-06-28 MED ORDER — SODIUM CHLORIDE 0.9% FLUSH
3.0000 mL | Freq: Two times a day (BID) | INTRAVENOUS | Status: DC
  Administered 2018-06-28 – 2018-06-29 (×2): 3 mL via INTRAVENOUS

## 2018-06-28 MED ORDER — LIDOCAINE HCL (PF) 1 % IJ SOLN
INTRAMUSCULAR | Status: DC | PRN
  Administered 2018-06-28 (×2): 1 mL

## 2018-06-28 MED ORDER — TICAGRELOR 90 MG PO TABS
90.0000 mg | ORAL_TABLET | Freq: Two times a day (BID) | ORAL | Status: DC
  Administered 2018-06-28 – 2018-06-29 (×2): 90 mg via ORAL
  Filled 2018-06-28 (×2): qty 1

## 2018-06-28 MED ORDER — ASPIRIN 81 MG PO CHEW: 81.0000 mg | CHEWABLE_TABLET | ORAL | Status: DC

## 2018-06-28 MED ORDER — HEPARIN (PORCINE) IN NACL 1000-0.9 UT/500ML-% IV SOLN
INTRAVENOUS | Status: AC
  Filled 2018-06-28: qty 1000

## 2018-06-28 MED ORDER — FLUTICASONE PROPIONATE 50 MCG/ACT NA SUSP
2.0000 | Freq: Every day | NASAL | Status: DC
  Filled 2018-06-28: qty 16

## 2018-06-28 MED ORDER — SODIUM CHLORIDE 0.9 % IV SOLN
INTRAVENOUS | Status: AC
  Administered 2018-06-28: 14:00:00 via INTRAVENOUS

## 2018-06-28 MED ORDER — LABETALOL HCL 5 MG/ML IV SOLN: 10.0000 mg | INTRAVENOUS | Status: AC | PRN

## 2018-06-28 MED ORDER — VERAPAMIL HCL 2.5 MG/ML IV SOLN
INTRAVENOUS | Status: AC
  Filled 2018-06-28: qty 2

## 2018-06-28 MED ORDER — TICAGRELOR 90 MG PO TABS
ORAL_TABLET | ORAL | Status: AC
  Filled 2018-06-28: qty 2

## 2018-06-28 MED ORDER — NITROGLYCERIN 0.4 MG SL SUBL: 0.4000 mg | SUBLINGUAL_TABLET | SUBLINGUAL | Status: DC | PRN

## 2018-06-28 MED ORDER — TICAGRELOR 90 MG PO TABS
ORAL_TABLET | ORAL | Status: DC | PRN
  Administered 2018-06-28: 180 mg via ORAL

## 2018-06-28 MED ORDER — HYDROCHLOROTHIAZIDE 25 MG PO TABS
25.0000 mg | ORAL_TABLET | Freq: Every day | ORAL | Status: DC
  Administered 2018-06-29: 10:00:00 25 mg via ORAL
  Filled 2018-06-28: qty 1

## 2018-06-28 MED ORDER — CYCLOBENZAPRINE HCL 10 MG PO TABS
10.0000 mg | ORAL_TABLET | Freq: Every evening | ORAL | Status: DC | PRN
  Administered 2018-06-28: 22:00:00 10 mg via ORAL
  Filled 2018-06-28: qty 1

## 2018-06-28 MED ORDER — VALACYCLOVIR HCL 500 MG PO TABS
2000.0000 mg | ORAL_TABLET | Freq: Two times a day (BID) | ORAL | Status: DC | PRN
  Filled 2018-06-28: qty 4

## 2018-06-28 MED ORDER — GABAPENTIN 600 MG PO TABS
600.0000 mg | ORAL_TABLET | Freq: Three times a day (TID) | ORAL | Status: DC
  Administered 2018-06-28 – 2018-06-29 (×3): 600 mg via ORAL
  Filled 2018-06-28 (×3): qty 1

## 2018-06-28 MED ORDER — LOSARTAN POTASSIUM-HCTZ 100-12.5 MG PO TABS: 1.0000 | ORAL_TABLET | Freq: Every day | ORAL | Status: DC

## 2018-06-28 MED ORDER — MORPHINE SULFATE (PF) 10 MG/ML IV SOLN
INTRAVENOUS | Status: DC | PRN
  Administered 2018-06-28: 2 mg via INTRAVENOUS

## 2018-06-28 SURGICAL SUPPLY — 28 items
BALLN EMERGE MR 2.0X12 (BALLOONS) ×2
BALLN ~~LOC~~ EMERGE MR 2.5X15 (BALLOONS) ×2
BALLN ~~LOC~~ EMERGE MR 2.75X12 (BALLOONS) ×2
BALLOON EMERGE MR 2.0X12 (BALLOONS) ×1 IMPLANT
BALLOON ~~LOC~~ EMERGE MR 2.5X15 (BALLOONS) ×1 IMPLANT
BALLOON ~~LOC~~ EMERGE MR 2.75X12 (BALLOONS) ×1 IMPLANT
CATH 5FR JL3.5 JR4 ANG PIG MP (CATHETERS) ×2 IMPLANT
CATH BALLN WEDGE 5F 110CM (CATHETERS) ×2 IMPLANT
CATH INFINITI JR4 5F (CATHETERS) ×2 IMPLANT
CATH LAUNCHER 6FR EBU 3 (CATHETERS) ×2 IMPLANT
CATH LAUNCHER 6FR EBU3.5 (CATHETERS) ×2 IMPLANT
CATH OPTICROSS 40MHZ (CATHETERS) ×2 IMPLANT
DEVICE RAD COMP TR BAND LRG (VASCULAR PRODUCTS) ×2 IMPLANT
GLIDESHEATH SLEND SS 6F .021 (SHEATH) ×2 IMPLANT
GUIDEWIRE INQWIRE 1.5J.035X260 (WIRE) ×1 IMPLANT
INQWIRE 1.5J .035X260CM (WIRE) ×2
KIT ENCORE 26 ADVANTAGE (KITS) ×2 IMPLANT
KIT HEART LEFT (KITS) ×2 IMPLANT
KIT HEMO VALVE WATCHDOG (MISCELLANEOUS) ×2 IMPLANT
PACK CARDIAC CATHETERIZATION (CUSTOM PROCEDURE TRAY) ×2 IMPLANT
SHEATH GLIDE SLENDER 4/5FR (SHEATH) ×2 IMPLANT
SLED PULL BACK IVUS (MISCELLANEOUS) ×2 IMPLANT
STENT SYNERGY DES 2.25X20 (Permanent Stent) ×2 IMPLANT
STENT SYNERGY DES 2.5X16 (Permanent Stent) ×2 IMPLANT
TRANSDUCER W/STOPCOCK (MISCELLANEOUS) ×2 IMPLANT
TUBING CIL FLEX 10 FLL-RA (TUBING) ×2 IMPLANT
WIRE COUGAR XT STRL 190CM (WIRE) ×2 IMPLANT
WIRE HI TORQ BMW 190CM (WIRE) ×2 IMPLANT

## 2018-06-28 NOTE — Brief Op Note (Signed)
BRIEF CARDIAC CATHETERIZATION NOTE  DATE: 06/28/2018 TIME: 11:18 AM  PATIENT:  Lonnie Duffy  77 y.o. male  PRE-OPERATIVE DIAGNOSIS:  Dyspnea and CAD with atypical angina  POST-OPERATIVE DIAGNOSIS:  Single vessel coronary artery disease  PROCEDURE:  Procedure(s): RIGHT/LEFT HEART CATH AND CORONARY ANGIOGRAPHY (N/A) CORONARY STENT INTERVENTION (N/A) Intravascular Ultrasound/IVUS (N/A)  SURGEON:  Surgeon(s) and Role:    * Obediah Welles, Harrell Gave, MD - Primary  FINDINGS: 1. Severe single-vessel CAD with 70-90% lesions at the proximal and distal edges proximal/mid LAD stent. 2. Low normal left and right heart filling pressures. 3. Normal LVEF. 4. Successful IVUS-guided PCI to proximal and mid LAD with placement of Synergy 2.5 x 16 mm (proximal) and 2.25 x 20 mm (distal) drug eluting stents overlapping the previously placed Taxus stent.  RECOMMENDATIONS: 1. Overnight extended recovery, given significant right arm pain during procedure secondary to positioning and arthritis. 2. DAPT with ASA and ticagrelor for at least 6 months; recommend indefinite DAPT given history of prior Taxus stent placement. 3. Aggressive secondary prevention.  Will check lipid panel with AM labs; atorvastatin may need to be escalated.  Nelva Bush, MD [facility] Pager: [phone]

## 2018-06-28 NOTE — Interval H&P Note (Signed)
History and Physical Interval Note:  06/28/2018 8:53 AM  Lonnie Duffy  has presented today for cardiac catheterization, with the diagnosis of diastolic heart failure. The various methods of treatment have been discussed with the patient and family. After consideration of risks, benefits and other options for treatment, the patient has consented to  Procedure(s): RIGHT/LEFT HEART CATH AND CORONARY ANGIOGRAPHY (N/A) as a surgical intervention .  The patient's history has been reviewed, patient examined, no change in status, stable for surgery.  I have reviewed the patient's chart and labs.  Questions were answered to the patient's satisfaction.    Cath Lab Visit (complete for each Cath Lab visit)  Clinical Evaluation Leading to the Procedure:   ACS: No.  Non-ACS:    Anginal/HF Classification: NYHA class III  Anti-ischemic medical therapy: No Therapy  Non-Invasive Test Results: Low-risk stress test findings: cardiac mortality <1%/year  Prior CABG: No previous CABG  Sharan Mcenaney

## 2018-06-28 NOTE — Care Management (Signed)
06-28-18 BENEFITS  CHECK :  #  6.  S/W  MONIQUIE  @ [employer] [facility] # [phone]  BRILINTA  90 MG BID COVER- YES CO-PAY- $ 30.00 TIER- NO PRIOR APPROVAL- NO  PREFERRED PHARMACY:  YES  [facility]

## 2018-06-29 ENCOUNTER — Encounter (HOSPITAL_COMMUNITY): Payer: Self-pay | Admitting: Internal Medicine

## 2018-06-29 DIAGNOSIS — T82855A Stenosis of coronary artery stent, initial encounter: Secondary | ICD-10-CM | POA: Diagnosis not present

## 2018-06-29 DIAGNOSIS — I10 Essential (primary) hypertension: Secondary | ICD-10-CM

## 2018-06-29 DIAGNOSIS — I25119 Atherosclerotic heart disease of native coronary artery with unspecified angina pectoris: Secondary | ICD-10-CM | POA: Diagnosis not present

## 2018-06-29 DIAGNOSIS — I2584 Coronary atherosclerosis due to calcified coronary lesion: Secondary | ICD-10-CM | POA: Diagnosis not present

## 2018-06-29 DIAGNOSIS — I25110 Atherosclerotic heart disease of native coronary artery with unstable angina pectoris: Secondary | ICD-10-CM | POA: Diagnosis not present

## 2018-06-29 DIAGNOSIS — I110 Hypertensive heart disease with heart failure: Secondary | ICD-10-CM | POA: Diagnosis not present

## 2018-06-29 LAB — BASIC METABOLIC PANEL
ANION GAP: 11 (ref 5–15)
BUN: 8 mg/dL (ref 8–23)
CHLORIDE: 101 mmol/L (ref 98–111)
CO2: 26 mmol/L (ref 22–32)
Calcium: 9.2 mg/dL (ref 8.9–10.3)
Creatinine, Ser: 1.09 mg/dL (ref 0.61–1.24)
GFR calc Af Amer: >60 mL/min (ref >60)
Glucose, Bld: 112 mg/dL — ABNORMAL HIGH (ref 70–99)
POTASSIUM: 3.9 mmol/L (ref 3.5–5.1)
SODIUM: 138 mmol/L (ref 135–145)

## 2018-06-29 LAB — LIPID PANEL
Cholesterol: 115 mg/dL (ref 0–200)
HDL: 35 mg/dL — AB (ref >40)
LDL CALC: 56 mg/dL (ref 0–99)
TRIGLYCERIDES: 121 mg/dL (ref <150)
Total CHOL/HDL Ratio: 3.3 RATIO
VLDL: 24 mg/dL (ref 0–40)

## 2018-06-29 LAB — CBC
HEMATOCRIT: 34.9 % — AB (ref 39.0–52.0)
HEMOGLOBIN: 11.6 g/dL — AB (ref 13.0–17.0)
MCH: 30.6 pg (ref 26.0–34.0)
MCHC: 33.2 g/dL (ref 30.0–36.0)
MCV: 92.1 fL (ref 78.0–100.0)
Platelets: 237 10*3/uL (ref 150–400)
RBC: 3.79 MIL/uL — ABNORMAL LOW (ref 4.22–5.81)
RDW: 13.4 % (ref 11.5–15.5)
WBC: 7.4 10*3/uL (ref 4.0–10.5)

## 2018-06-29 LAB — GLUCOSE, CAPILLARY: GLUCOSE-CAPILLARY: 104 mg/dL — AB (ref 70–99)

## 2018-06-29 MED ORDER — TICAGRELOR 90 MG PO TABS: 90.0000 mg | ORAL_TABLET | Freq: Two times a day (BID) | ORAL | 2 refills | Status: DC

## 2018-06-29 NOTE — Care Management Note (Signed)
Case Management Note  Patient Details  Name: Lonnie Duffy MRN: [id number] Date of Birth: 1940/12/02  Subjective/Objective:   From home, s/p stent intervention, will be on brilinta, RN will give the patient the 30 day savings card for brilinta.  NCM informed him of his co pay for refills and to call phone number on card for any questions.                  Action/Plan: DC home when ready.  Expected Discharge Date:                  Expected Discharge Plan:  Home/Self Care  In-House Referral:     Discharge planning Services  CM Consult, Medication Assistance  Post Acute Care Choice:    Choice offered to:     DME Arranged:    DME Agency:     HH Arranged:    HH Agency:     Status of Service:  Completed, signed off  If discussed at [employer] of Stay Meetings, dates discussed:    Additional Comments:  Zenon Mayo, RN 06/29/2018, 9:11 AM

## 2018-06-29 NOTE — Progress Notes (Signed)
CARDIAC REHAB PHASE I   PRE:  Rate/Rhythm: 90 SR  BP:  Sitting: 142/59      SaO2: 97 RA  MODE:  Ambulation: 1000 ft  108 peak HR  POST:  Rate/Rhythm: 98 SR  BP:  Sitting: 165/75    SaO2: 98 RA   Pt ambulated 1066ft in hallway independently with steady gait. Pt c/o slight SOB, but denies CP. Pt and family educated on importance of of ASA, Brilinta, statin and NTG. Stent card at bedside. Pt given heart healthy diet. Reviewed restrictions and exercise guidelines. Will refer to CRP II [place].   [id number] Rufina Falco, RN BSN 06/29/2018 8:53 AM

## 2018-06-29 NOTE — Discharge Instructions (Signed)
Aspirin and Your Heart Aspirin is a medicine that affects the way blood clots. Aspirin can be used to help reduce the risk of blood clots, heart attacks, and other heart-related problems. Should I take aspirin? Your health care provider will help you determine whether it is safe and beneficial for you to take aspirin daily. Taking aspirin daily may be beneficial if you:  Have had a heart attack or chest pain.  Have undergone open heart surgery such as coronary artery bypass surgery (CABG).  Have had coronary angioplasty.  Have experienced a stroke or transient ischemic attack (TIA).  Have peripheral vascular disease (PVD).  Have chronic heart rhythm problems such as atrial fibrillation.  Are there any risks of taking aspirin daily? Daily use of aspirin can increase your risk of side effects. Some of these include:  Bleeding. Bleeding problems can be minor or serious. An example of a minor problem is a cut that does not stop bleeding. An example of a more serious problem is stomach bleeding or bleeding into the brain. Your risk of bleeding is increased if you are also taking non-steroidal anti-inflammatory medicine (NSAIDs).  Increased bruising.  Upset stomach.  An allergic reaction. People who have nasal polyps have an increased risk of developing an aspirin allergy.  What are some guidelines I should follow when taking aspirin?  Take aspirin only as directed by your health care provider. Make sure you understand how much you should take and what form you should take. The two forms of aspirin are: ? Non-enteric-coated. This type of aspirin does not have a coating and is absorbed quickly. Non-enteric-coated aspirin is usually recommended for people with chest pain. This type of aspirin also comes in a chewable form. ? Enteric-coated. This type of aspirin has a special coating that releases the medicine very slowly. Enteric-coated aspirin causes less stomach upset than  non-enteric-coated aspirin. This type of aspirin should not be chewed or crushed.  Drink alcohol in moderation. Drinking alcohol increases your risk of bleeding. When should I seek medical care?  You have unusual bleeding or bruising.  You have stomach pain.  You have an allergic reaction. Symptoms of an allergic reaction include: ? Hives. ? Itchy skin. ? Swelling of the lips, tongue, or face.  You have ringing in your ears. When should I seek immediate medical care?  Your bowel movements are bloody, dark red, or black in color.  You vomit or cough up blood.  You have blood in your urine.  You cough, wheeze, or feel short of breath. If you have any of the following symptoms, this is an emergency. Do not wait to see if the pain will go away. Get medical help at once. Call your local emergency services (911 in the [place] Do not drive yourself to the hospital.  You have severe chest pain, especially if the pain is crushing or pressure-like and spreads to the arms, back, neck, or jaw.  You have stroke-like symptoms, such as: ? Loss of vision. ? Difficulty talking. ? Numbness or weakness on one side of your body. ? Numbness or weakness in your arm or leg. ? Not thinking clearly or feeling confused.  This information is not intended to replace advice given to you by your health care provider. Make sure you discuss any questions you have with your health care provider. Document Released: 09/11/2008 Document Revised: 02/06/2016 Document Reviewed: 01/04/2014 Elsevier Interactive Patient Education  2018 [place].   Coronary Angiogram With Stent, Care After This sheet  gives you information about how to care for yourself after your procedure. Your health care provider may also give you more specific instructions. If you have problems or questions, contact your health care provider. What can I expect after the procedure? After your procedure, it is common to have:  Bruising in  the area where a small, thin tube (catheter) was inserted. This usually fades within 1-2 weeks.  Blood collecting in the tissue (hematoma) that may be painful to the touch. It should usually decrease in size and tenderness within 1-2 weeks.  Follow these instructions at home: Insertion area care  Do not take baths, swim, or use a hot tub until your health care provider approves.  You may shower 24-48 hours after the procedure or as directed by your health care provider.  Follow instructions from your health care provider about how to take care of your incision. Make sure you: ? Wash your hands with soap and water before you change your bandage (dressing). If soap and water are not available, use hand sanitizer. ? Change your dressing as told by your health care provider. ? Leave stitches (sutures), skin glue, or adhesive strips in place. These skin closures may need to stay in place for 2 weeks or longer. If adhesive strip edges start to loosen and curl up, you may trim the loose edges. Do not remove adhesive strips completely unless your health care provider tells you to do that.  Remove the bandage (dressing) and gently wash the catheter insertion site with plain soap and water.  Pat the area dry with a clean towel. Do not rub the area, because that may cause bleeding.  Do not apply powder or lotion to the incision area.  Check your incision area every day for signs of infection. Check for: ? More redness, swelling, or pain. ? More fluid or blood. ? Warmth. ? Pus or a bad smell. Activity  Do not drive for 24 hours if you were given a medicine to help you relax (sedative).  Do not lift anything that is heavier than 10 lb (4.5 kg) for 5 days after your procedure or as directed by your health care provider.  Ask your health care provider when it is okay for you: ? To return to work or school. ? To resume usual physical activities or sports. ? To resume sexual activity. Eating and  drinking  Eat a heart-healthy diet. This should include plenty of fresh fruits and vegetables.  Avoid the following types of food: ? Food that is high in salt. ? Canned or highly processed food. ? Food that is high in saturated fat or sugar. ? [employer].  Limit alcohol intake to no more than 1 drink a day for non-pregnant women and 2 drinks a day for men. One drink equals 12 oz of beer, 5 oz of wine, or 1 oz of hard liquor. Lifestyle  Do not use any products that contain nicotine or tobacco, such as cigarettes and e-cigarettes. If you need help quitting, ask your health care provider.  Take steps to manage and control your weight.  Get regular exercise.  Manage your blood pressure.  Manage other health problems, such as diabetes. General instructions  Take over-the-counter and prescription medicines only as told by your health care provider. Blood thinners may be prescribed after your procedure to improve blood flow through the stent.  If you need an MRI after your heart stent has been placed, be sure to tell the health care provider  who orders the MRI that you have a heart stent.  Keep all follow-up visits as directed by your health care provider. This is important. Contact a health care provider if:  You have a fever.  You have chills.  You have increased bleeding from the catheter insertion area. Hold pressure on the area. Get help right away if:  You develop chest pain or shortness of breath.  You feel faint or you pass out.  You have unusual pain at the catheter insertion area.  You have redness, warmth, or swelling at the catheter insertion area.  You have drainage (other than a small amount of blood on the dressing) from the catheter insertion area.  The catheter insertion area is bleeding, and the bleeding does not stop after 30 minutes of holding steady pressure on the area.  You develop bleeding from any other place, such as from your rectum. There may be  bright red blood in your urine or stool, or it may appear as black, tarry stool. This information is not intended to replace advice given to you by your health care provider. Make sure you discuss any questions you have with your health care provider. Document Released: 04/18/2005 Document Revised: 06/26/2016 Document Reviewed: 06/26/2016 Elsevier Interactive Patient Education  [employer].

## 2018-06-29 NOTE — Plan of Care (Signed)
  Problem: Education: Goal: Knowledge of General Education information will improve Description Including pain rating scale, medication(s)/side effects and non-pharmacologic comfort measures 06/29/2018 1022 by Jeanne Ivan, RN Outcome: Completed/Met 06/29/2018 0905 by Jeanne Ivan, RN Outcome: Progressing   Problem: Clinical Measurements: Goal: Ability to maintain clinical measurements within normal limits will improve 06/29/2018 1022 by Jeanne Ivan, RN Outcome: Completed/Met 06/29/2018 0905 by Jeanne Ivan, RN Outcome: Progressing Goal: Will remain free from infection 06/29/2018 1022 by Jeanne Ivan, RN Outcome: Completed/Met 06/29/2018 0905 by Jeanne Ivan, RN Outcome: Progressing Goal: Diagnostic test results will improve 06/29/2018 1022 by Jeanne Ivan, RN Outcome: Completed/Met 06/29/2018 0905 by Jeanne Ivan, RN Outcome: Progressing Goal: Respiratory complications will improve 06/29/2018 1022 by Jeanne Ivan, RN Outcome: Completed/Met 06/29/2018 0905 by Jeanne Ivan, RN Outcome: Progressing Goal: Cardiovascular complication will be avoided 06/29/2018 1022 by Jeanne Ivan, RN Outcome: Completed/Met 06/29/2018 0905 by Jeanne Ivan, RN Outcome: Progressing   Problem: Education: Goal: Understanding of CV disease, CV risk reduction, and recovery process will improve 06/29/2018 1022 by Jeanne Ivan, RN Outcome: Completed/Met 06/29/2018 0905 by Jeanne Ivan, RN Outcome: Progressing Goal: Individualized Educational Video(s) 06/29/2018 1022 by Jeanne Ivan, RN Outcome: Completed/Met 06/29/2018 0905 by Jeanne Ivan, RN Outcome: Progressing   Problem: Health Behavior/Discharge Planning: Goal: Ability to safely manage health-related needs after discharge will improve 06/29/2018 1022 by Jeanne Ivan, RN Outcome: Completed/Met 06/29/2018 0905 by Jeanne Ivan, RN Outcome: Progressing

## 2018-06-29 NOTE — Progress Notes (Signed)
Gave patient Brilinta card. Answered all questions.

## 2018-06-29 NOTE — Plan of Care (Signed)
  Problem: Education: Goal: Knowledge of General Education information will improve Description Including pain rating scale, medication(s)/side effects and non-pharmacologic comfort measures Outcome: Progressing   Problem: Clinical Measurements: Goal: Ability to maintain clinical measurements within normal limits will improve Outcome: Progressing Goal: Will remain free from infection Outcome: Progressing Goal: Diagnostic test results will improve Outcome: Progressing Goal: Respiratory complications will improve Outcome: Progressing Goal: Cardiovascular complication will be avoided Outcome: Progressing   Problem: Education: Goal: Understanding of CV disease, CV risk reduction, and recovery process will improve Outcome: Progressing Goal: Individualized Educational Video(s) Outcome: Progressing

## 2018-06-29 NOTE — Discharge Summary (Addendum)
Discharge Summary    Patient ID: Lonnie Duffy,  MRN: [id number], DOB/AGE: Aug 09, 1941 77 y.o.  Admit date: 06/28/2018 Discharge date: 06/29/2018  Primary Care Provider: Caryl Bis Primary Cardiologist: Dr. Bronson Ing  Discharge Diagnoses    Principal Problem:   Dyspnea on exertion Active Problems:   CAD in native artery   Coronary artery disease involving native coronary artery of native heart with angina pectoris ([place])   Hypertension   Allergies Allergies  Allergen Reactions  . Eggs Or Egg-Derived Products   . Flagyl [Metronidazole]   . Penicillins Rash    Has patient had a PCN reaction causing immediate rash, facial/tongue/throat swelling, SOB or lightheadedness with hypotension: Yes Has patient had a PCN reaction causing severe rash involving mucus membranes or skin necrosis: No Has patient had a PCN reaction that required hospitalization: No Has patient had a PCN reaction occurring within the last 10 years: No If all of the above answers are "NO", then may proceed with Cephalosporin use.'    Diagnostic Studies/Procedures    Cath: 06/28/18  Conclusions: 1. Severe single-vessel coronary artery involving proximal and distal edges/segments of previously placed proximal LAD stent. 2. Mild, non-obstructive CAD involving mid RCA. 3. Normal left heart, right heart, and pulmonary artery pressures. 4. Normal left ventricular systolic function and Fick cardiac output. 5. Successful IVUS-guided PCI to proximal and mid LAD with placement of Synergy 2.5 x 16 mm and Synergy 2.25 x 20 mm drug-eluting stents.  Both stents overlap the previously placed Taxus stent in the proximal LAD.  Final angiogram shows 0% residual stenosis with TIMI-3 flow.  Recommendations: 1. Aggressive secondary prevention. 2. Overnight extended recovery due to significant right arm/shoulder pain during and after catheterization secondary to chronic arthritis.  Recommend dual antiplatelet therapy  with aspirin and ticagrelor for at least 12 months followed by indefinite dual antiplatelet therapy (aspirin and ticagrelor or clopidogrel) because of prior Taxus stent placed in the proximal LAD and elevated risk for late stent thrombosis.  Nelva Bush, MD _____________   History of Present Illness     77 yo male with PMH of coronary artery disease and [employer] Taxus drug-eluting stent placement to the LAD on 06/07/2004. Other medical history includes GERD, dyslipidemia, rheumatoid arthritis, hypertension, and chronic reactive airway disease.   With stress testing, there were T wave inversions in 2, 3, and aVF and returning to baseline after 1 to 5 minutes of recovery.  There was downsloping ST segment depression of 1 mm in V4, V5, V6, 2, 3, and aVF also returning to baseline within a few minutes of recovery.  Myocardial perfusion was unremarkable.  Echocardiogram demonstrated normal left ventricular systolic function and regional wall motion, LVEF 55 to 60%.  There was no significant valvular regurgitation.  There was aortic and mitral annular calcification.  He used to be very active but reported experiencing premature fatigue and exertional dyspnea.  He normally digs ditches by himself, does plumbing work, does Dealer work, and all sorts of outdoor yard work.  He had been unable to keep up with his normal activities. He continued to experience episodic low blood pressures associated with dizziness but denies syncope. Given symptoms and stress testing he was set up for outpatient cardiac cath.   Hospital Course     Underwent cardiac cath noted ISR in the mLAD of previous taxus stent. Normal left and right hear pressures. Successful PCI/DES x1. Plan for DAPT with ASA/Brilinta for at least 12 months. No recurrent chest  pain. He was continued on Lipitor 10mg  daily given his low LDL. Discussed adding BB with patient but reports issues with BB therapy in the past. Will defer  decision to add to primary cardiologist.   General: Well developed, well nourished, male appearing in no acute distress. Head: Normocephalic, atraumatic.  Neck: Supple without bruits, JVD. Lungs:  Resp regular and unlabored, CTA. Heart: RRR, S1, S2, no S3, S4, or murmur; no rub. Abdomen: Soft, non-tender, non-distended with normoactive bowel sounds. No hepatomegaly. No rebound/guarding. No obvious abdominal masses. Extremities: No clubbing, cyanosis, edema. Distal pedal pulses are 2+ bilaterally. Right radial cath site stable without bruising or hematoma Neuro: Alert and oriented X 3. Moves all extremities spontaneously. Psych: Normal affect.  [name] was seen by Dr. Angelena Form and determined stable for discharge home. Follow up in the office has been arranged. Medications are listed below.   _____________  Discharge Vitals Blood pressure (!) 165/75, pulse 75, temperature 97.6 F (36.4 C), temperature source Oral, resp. rate 13, height 5\' 7"  (1.702 m), weight 78 kg, SpO2 99 %.  Filed Weights   06/28/18 0709 06/28/18 1145 06/29/18 0614  Weight: 77.1 kg 77.1 kg 78 kg    Labs & Radiologic Studies    CBC Recent Labs    06/29/18 0247  WBC 7.4  HGB 11.6*  HCT 34.9*  MCV 92.1  PLT 161   Basic Metabolic Panel Recent Labs    06/29/18 0247  NA 138  K 3.9  CL 101  CO2 26  GLUCOSE 112*  BUN 8  CREATININE 1.09  CALCIUM 9.2   Liver Function Tests No results for input(s): AST, ALT, ALKPHOS, BILITOT, PROT, ALBUMIN in the last 72 hours. No results for input(s): LIPASE, AMYLASE in the last 72 hours. Cardiac Enzymes No results for input(s): CKTOTAL, CKMB, CKMBINDEX, TROPONINI in the last 72 hours. BNP Invalid input(s): POCBNP D-Dimer No results for input(s): DDIMER in the last 72 hours. Hemoglobin A1C No results for input(s): HGBA1C in the last 72 hours. Fasting Lipid Panel Recent Labs    06/29/18 0247  CHOL 115  HDL 35*  LDLCALC 56  TRIG 121  CHOLHDL 3.3    Thyroid Function Tests No results for input(s): TSH, T4TOTAL, T3FREE, THYROIDAB in the last 72 hours.  Invalid input(s): FREET3 _____________  No results found. Disposition   Pt is being discharged home today in good condition.  Follow-up Plans & Appointments    Follow-up Information    Herminio Commons, MD Follow up on 07/22/2018.   Specialty:  Cardiology Why:  with Rosaria Ferries, PA who works with Dr. Raliegh Ip at 2:30pm Contact information: [place] [id number] [phone]          Discharge Instructions    AMB Referral to Cardiac Rehabilitation - Phase II   Complete by:  As directed    Diagnosis:  Coronary Stents   Amb Referral to Cardiac Rehabilitation   Complete by:  As directed    Diagnosis:  Coronary Stents   Call MD for:  redness, tenderness, or signs of infection (pain, swelling, redness, odor or green/yellow discharge around incision site)   Complete by:  As directed    Diet - low sodium heart healthy   Complete by:  As directed    Discharge instructions   Complete by:  As directed    Radial Site Care Refer to this sheet in the next few weeks. These instructions provide you with information on caring for yourself after  your procedure. Your caregiver may also give you more specific instructions. Your treatment has been planned according to current medical practices, but problems sometimes occur. Call your caregiver if you have any problems or questions after your procedure. HOME CARE INSTRUCTIONS You may shower the day after the procedure.Remove the bandage (dressing) and gently wash the site with plain soap and water.Gently pat the site dry.  Do not apply powder or lotion to the site.  Do not submerge the affected site in water for 3 to 5 days.  Inspect the site at least twice daily.  Do not flex or bend the affected arm for 24 hours.  No lifting over 5 pounds (2.3 kg) for 5 days after your procedure.  Do not drive home if you are  discharged the same day of the procedure. Have someone else drive you.  You may drive 24 hours after the procedure unless otherwise instructed by your caregiver.  What to expect: Any bruising will usually fade within 1 to 2 weeks.  Blood that collects in the tissue (hematoma) may be painful to the touch. It should usually decrease in size and tenderness within 1 to 2 weeks.  SEEK IMMEDIATE MEDICAL CARE IF: You have unusual pain at the radial site.  You have redness, warmth, swelling, or pain at the radial site.  You have drainage (other than a small amount of blood on the dressing).  You have chills.  You have a fever or persistent symptoms for more than 72 hours.  You have a fever and your symptoms suddenly get worse.  Your arm becomes pale, cool, tingly, or numb.  You have heavy bleeding from the site. Hold pressure on the site.    PLEASE DO NOT MISS ANY DOSES OF YOUR BRILINTA!!!!! Also keep a log of you blood pressures and bring back to your follow up appt. Please call the office with any questions.   Patients taking blood thinners should generally stay away from medicines like ibuprofen, Advil, Motrin, naproxen, and Aleve due to risk of stomach bleeding. You may take Tylenol as directed or talk to your primary doctor about alternatives.   Increase activity slowly   Complete by:  As directed       Discharge Medications     Medication List    TAKE these medications   aspirin EC 81 MG tablet Take 81 mg by mouth daily.   atorvastatin 10 MG tablet Commonly known as:  LIPITOR Take 10 mg by mouth at bedtime.   CALCIUM 500 PO Take 1 tablet by mouth 2 (two) times daily.   cyclobenzaprine 10 MG tablet Commonly known as:  FLEXERIL Take 10 mg by mouth at bedtime as needed for muscle spasms.   Fish Oil 1000 MG Caps Take 2,000 mg by mouth 2 (two) times daily.   fluticasone 50 MCG/ACT nasal spray Commonly known as:  FLONASE Place 2 sprays into both nostrils daily.   folic  acid 1 MG tablet Commonly known as:  FOLVITE Take 1 mg by mouth daily.   gabapentin 600 MG tablet Commonly known as:  NEURONTIN Take 600 mg by mouth 3 (three) times daily.   losartan-hydrochlorothiazide 100-12.5 MG tablet Commonly known as:  HYZAAR Take 1 tablet by mouth daily.   methotrexate 2.5 MG tablet Take 20 mg by mouth every Saturday.   nitroGLYCERIN 0.4 MG SL tablet Commonly known as:  NITROSTAT Place 1 tablet (0.4 mg total) under the tongue every 5 (five) minutes as needed for chest pain.  pantoprazole 40 MG tablet Commonly known as:  PROTONIX Take 40 mg by mouth daily.   polyethylene glycol packet Commonly known as:  MIRALAX / GLYCOLAX Take 17 g by mouth daily.   tamsulosin 0.4 MG Caps capsule Commonly known as:  FLOMAX Take 0.4 mg by mouth daily.   ticagrelor 90 MG Tabs tablet Commonly known as:  BRILINTA Take 1 tablet (90 mg total) by mouth 2 (two) times daily.   valACYclovir 500 MG tablet Commonly known as:  VALTREX Take 2,000 mg by mouth 2 (two) times daily as needed (fever blisters).   Vitamin D3 5000 units Tabs Take 5,000 Units by mouth daily.        Acute coronary syndrome (MI, NSTEMI, STEMI, etc) this admission?: No.     Outstanding Labs/Studies   N/a   Duration of Discharge Encounter   Greater than 30 minutes including physician time.  Signed, Reino Bellis NP-C 06/29/2018, 10:11 AM   I have personally seen and examined this patient. I agree with the assessment and plan as outlined above.  He was admitted with unstable angina. Cardiac cath with restenosis LAD stent treated with 2 drug eluting stents. He is doing well. Cath site ok right radial artery. Will be discharged home today on ASA and Brilinta. Follow up with Dr. Raliegh Ip in 1-2 weeks.   Lauree Chandler 06/29/2018 11:07 AM

## 2018-06-29 NOTE — Progress Notes (Signed)
Discharge instructions (including medications) discussed with and copy provided to patient/caregiver 

## 2018-07-08 IMAGING — NM NM MYOCAR MULTI W/SPECT W/WALL MOTION & EF
2 series · 12 of 12 positions shown · non-contrast
Comparison: none

[Series 1: rest · 6.51mm/px · 6 of 64 frames shown]
[frame 6/64]
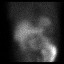
[frame 16/64]
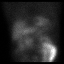
[frame 27/64]
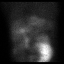
[frame 38/64]
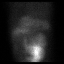
[frame 48/64]
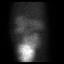
[frame 59/64]
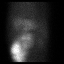

[Series 2: stress gated - perfusion · 6.51mm/px · 6 of 64 frames shown]
[frame 6/64]
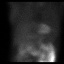
[frame 16/64]
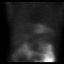
[frame 27/64]
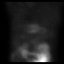
[frame 38/64]
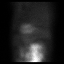
[frame 48/64]
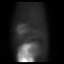
[frame 59/64]
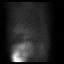

[12 of 12 positions shown; findings below may reference images not displayed]

Canned report from images found in remote index.

Refer to host system for actual result text.

## 2018-07-22 ENCOUNTER — Ambulatory Visit: Payer: Medicare Other | Admitting: Physician Assistant

## 2018-07-22 ENCOUNTER — Encounter: Payer: Self-pay | Admitting: Physician Assistant

## 2018-07-22 VITALS — BP 106/56 | HR 97 | Ht 67.0 in | Wt 171.8 lb

## 2018-07-22 DIAGNOSIS — K219 Gastro-esophageal reflux disease without esophagitis: Secondary | ICD-10-CM

## 2018-07-22 DIAGNOSIS — I2510 Atherosclerotic heart disease of native coronary artery without angina pectoris: Secondary | ICD-10-CM

## 2018-07-22 NOTE — Patient Instructions (Signed)
Medication Instructions:  Your physician recommends that you continue on your current medications as directed. Please refer to the Current Medication list given to you today.  If you need a refill on your cardiac medications before your next appointment, please call your pharmacy.   Lab work: NONE  If you have labs (blood work) drawn today and your tests are completely normal, you will receive your results only by: . MyChart Message (if you have MyChart) OR . A paper copy in the mail If you have any lab test that is abnormal or we need to change your treatment, we will call you to review the results.  Testing/Procedures: NONE   Follow-Up: At [facility], you and your health needs are our priority.  As part of our continuing mission to provide you with exceptional heart care, we have created designated Provider Care Teams.  These Care Teams include your primary Cardiologist (physician) and Advanced Practice Providers (APPs -  Physician Assistants and Nurse Practitioners) who all work together to provide you with the care you need, when you need it. You will need a follow up appointment in 3 months.  Please call our office 2 months in advance to schedule this appointment.  You may see Suresh Koneswaran, MD or one of the following Advanced Practice Providers on your designated Care Team:   Brittany Strader, PA-C ([place] Office) . Michele Lenze, PA-C ([place] Office)  Any Other Special Instructions Will Be Listed Below (If Applicable). Thank you for choosing [facility]!     

## 2018-07-22 NOTE — Progress Notes (Signed)
Cardiology Office Note   Date:  07/22/2018   ID:  Doy, Taaffe 1941/07/20, MRN [id number]  PCP:  Caryl Bis, MD Cardiologist:  Kate Sable, MD 06/21/2018 Rosaria Ferries, PA-C   No chief complaint on file.   History of Present Illness: Lonnie Duffy is a 77 y.o. male with a history of [employer] Taxus DES LAD 06/07/2004. Other medical history includes GERD, dyslipidemia, rheumatoid arthritis, hypertension, and chronic reactive airway disease  Admitted 09/16-09/17/2019 w/ DOE>> prev stress test abnl>>cath w/ ISR LAD stent s/p DES x 1  Lonnie Duffy presents for cardiology follow up.  He is walking a mile at 2 mph on his treadmill and has done well w/ this. He walks the dog several times a day as well, is doing some hills.   He had some DOE, but that improved. He thinks that is from the [place].   He has severe reflux, eats very little at night because of that.   Wants to change to Plavix because that is only once daily. That will be easier on his stomach.   No LE edema. No orthopnea or PND. He does not sleep well due to MS issues, but not SOB.  No palpitations, no presyncope or syncope.   When he wakes from a nap, he will be shaking. It feels like his whole body is shaking, but there are not outwards signs of it. This only lasts a few seconds, and resolves.    Past Medical History:  Diagnosis Date  . ASCVD (arteriosclerotic cardiovascular disease)   . Chest pain   . Collagen vascular disease ([place])   . Complication of anesthesia    "I had local anesthesia , and I went out for 3 hrs "  . 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 Coronary artery disease   . Diabetes mellitus without complication ([place])   . Dyspnea   . GERD (gastroesophageal reflux disease)   . Hypertension   . Palpitations     Past Surgical History:  Procedure Laterality Date  . APPENDECTOMY  1987  . CARDIAC CATHETERIZATION    . CATARACT EXTRACTION  11/2009 & 12/2015  .  CORONARY STENT INTERVENTION  06/28/2018  . CORONARY STENT INTERVENTION N/A 06/28/2018   Procedure: CORONARY STENT INTERVENTION;  Surgeon: Nelva Bush, MD;  Location: [facility];  Service: Cardiovascular;  Laterality: N/A;  . GALLBLADDER SURGERY  1989  . INTRAVASCULAR ULTRASOUND/IVUS N/A 06/28/2018   Procedure: Intravascular Ultrasound/IVUS;  Surgeon: Nelva Bush, MD;  Location: [facility];  Service: Cardiovascular;  Laterality: N/A;  . NASAL SINUS SURGERY  11/21/1987  . RIGHT/LEFT HEART CATH AND CORONARY ANGIOGRAPHY N/A 06/28/2018   Procedure: RIGHT/LEFT HEART CATH AND CORONARY ANGIOGRAPHY;  Surgeon: Nelva Bush, MD;  Location: [facility];  Service: Cardiovascular;  Laterality: N/A;  . SHOULDER SURGERY  2007  . stent in lad  06/07/2004  . TONSILLECTOMY  1971  . trigerfinger  1990    Current Outpatient Medications  Medication Sig Dispense Refill  . aspirin EC 81 MG tablet Take 81 mg by mouth daily.    Marland Kitchen atorvastatin (LIPITOR) 10 MG tablet Take 10 mg by mouth at bedtime.     . Calcium-Magnesium-Vitamin D (CALCIUM 500 PO) Take 1 tablet by mouth 2 (two) times daily.    . Cholecalciferol (VITAMIN D3) 5000 units TABS Take 5,000 Units by mouth daily.     . cyclobenzaprine (FLEXERIL) 10 MG tablet Take 10 mg by mouth  at bedtime as needed for muscle spasms.     . fluticasone (FLONASE) 50 MCG/ACT nasal spray Place 2 sprays into both nostrils daily.    . folic acid (FOLVITE) 1 MG tablet Take 1 mg by mouth daily.    Marland Kitchen gabapentin (NEURONTIN) 600 MG tablet Take 600 mg by mouth 3 (three) times daily.    Marland Kitchen losartan-hydrochlorothiazide (HYZAAR) 100-12.5 MG tablet Take 1 tablet by mouth daily.     . methotrexate 2.5 MG tablet Take 20 mg by mouth every Saturday.     . nitroGLYCERIN (NITROSTAT) 0.4 MG SL tablet Place 1 tablet (0.4 mg total) under the tongue every 5 (five) minutes as needed for chest pain. 25 tablet 3  . Omega-3 Fatty Acids (FISH OIL) 1000 MG CAPS Take 2,000  mg by mouth 2 (two) times daily.     . pantoprazole (PROTONIX) 40 MG tablet Take 40 mg by mouth daily.    . polyethylene glycol (MIRALAX / GLYCOLAX) packet Take 17 g by mouth daily.    . tamsulosin (FLOMAX) 0.4 MG CAPS capsule Take 0.4 mg by mouth daily.    . ticagrelor (BRILINTA) 90 MG TABS tablet Take 1 tablet (90 mg total) by mouth 2 (two) times daily. 180 tablet 2  . valACYclovir (VALTREX) 500 MG tablet Take 2,000 mg by mouth 2 (two) times daily as needed (fever blisters).      No current facility-administered medications for this visit.     Allergies:   Eggs or egg-derived products; Flagyl [metronidazole]; and Penicillins    Social History:  The patient  reports that he has never smoked. He has never used smokeless tobacco. He reports that he does not drink alcohol or use drugs.   Family History:  The patient's family history includes Diabetes in his father; Heart attack in his father and mother.  He indicated that his mother is deceased. He indicated that his father is deceased.    ROS:  Please see the history of present illness. All other systems are reviewed and negative.    PHYSICAL EXAM: VS:  BP (!) 106/56   Pulse 97   Ht 5\' 7"  (1.702 m)   Wt 171 lb 12.8 oz (77.9 kg)   SpO2 98%   BMI 26.91 kg/m  , BMI Body mass index is 26.91 kg/m. GEN: Well nourished, well developed, male in no acute distress HEENT: normal for age  Neck: no JVD, no carotid bruit, no masses Cardiac: RRR; SEM and short DM, no rubs, or gallops Respiratory:  clear to auscultation bilaterally, normal work of breathing GI: soft, nontender, nondistended, + BS MS: no deformity or atrophy; no edema; distal pulses are 2+ in all 4 extremities  Skin: warm and dry, no rash Neuro:  Strength and sensation are intact Psych: euthymic mood, full affect   EKG:  EKG is not ordered today.  MYOVIEW: 04/08/2018  Blood pressure demonstrated a hypertensive response to exercise.  T wave inversion was noted during  stress in the II, III and aVF leads, and returning to baseline after 1-5 mins of recovery.  Downsloping ST segment depression ST segment depression of 1 mm was noted during stress in the V4, V5, V6, II, III and aVF leads, and returning to baseline after 1-5 minutes of recovery.  This is a low risk study based on myocardial perfusion. However, given ECG abnormalities and symptoms reported (mild chest tightness), clinical correlation is recommended.  Nuclear stress EF: 65%.   ECHO: 04/07/2018 - Left ventricle: The cavity size  was normal. Wall thickness was   increased in a pattern of mild LVH. Systolic function was normal.   The estimated ejection fraction was in the range of 55% to 60%.   Wall motion was normal; there were no regional wall motion   abnormalities. Left ventricular diastolic function parameters   were normal for the patient&'s age. - Aortic valve: Mildly calcified annulus. Probably trileaflet;   mildly calcified leaflets. - Mitral valve: Mildly to moderately calcified annulus. There was   trivial regurgitation. - Right atrium: Central venous pressure (est): 3 mm Hg. - Atrial septum: No defect or patent foramen ovale was identified. - Tricuspid valve: There was trivial regurgitation. - Pulmonary arteries: PA peak pressure: 10 mm Hg (S). - Pericardium, extracardiac: There was no pericardial effusion.   Cath: 06/28/18 Conclusions: 1. Severe single-vessel coronary artery involving proximal and distal edges/segments of previously placed proximal LAD stent. 2. Mild, non-obstructive CAD involving mid RCA. 3. Normal left heart, right heart, and pulmonary artery pressures. 4. Normal left ventricular systolic function and Fick cardiac output. 5. Successful IVUS-guided PCI to proximal and mid LAD with placement of Synergy 2.5 x 16 mm and Synergy 2.25 x 20 mm drug-eluting stents. Both stents overlap the previously placed Taxus stent in the proximal LAD. Final angiogram shows 0%  residual stenosis with TIMI-3 flow.  Recommendations: 1. Aggressive secondary prevention. 2. Overnight extended recovery due to significant right arm/shoulder pain during and after catheterization secondary to chronic arthritis.  Recommend dual antiplatelet therapy with aspirin and ticagrelor for at least 12 months followed by indefinite dual antiplatelet therapy (aspirin and ticagrelor or clopidogrel) because of prior Taxus stent placed in the proximal LAD and elevated risk for late stent thrombosis   Recent Labs: 06/29/2018: BUN 8; Creatinine, Ser 1.09; Hemoglobin 11.6; Platelets 237; Potassium 3.9; Sodium 138  CBC    Component Value Date/Time   WBC 7.4 06/29/2018 0247   RBC 3.79 (L) 06/29/2018 0247   HGB 11.6 (L) 06/29/2018 0247   HCT 34.9 (L) 06/29/2018 0247   PLT 237 06/29/2018 0247   MCV 92.1 06/29/2018 0247   MCH 30.6 06/29/2018 0247   MCHC 33.2 06/29/2018 0247   RDW 13.4 06/29/2018 0247   CMP Latest Ref Rng & Units 06/29/2018  Glucose 70 - 99 mg/dL 112(H)  BUN 8 - 23 mg/dL 8  Creatinine 0.61 - 1.24 mg/dL 1.09  Sodium 135 - 145 mmol/L 138  Potassium 3.5 - 5.1 mmol/L 3.9  Chloride 98 - 111 mmol/L 101  CO2 22 - 32 mmol/L 26  Calcium 8.9 - 10.3 mg/dL 9.2     Lipid Panel    Component Value Date/Time   CHOL 115 06/29/2018 0247   TRIG 121 06/29/2018 0247   HDL 35 (L) 06/29/2018 0247   CHOLHDL 3.3 06/29/2018 0247   VLDL 24 06/29/2018 0247   LDLCALC 56 06/29/2018 0247     Wt Readings from Last 3 Encounters:  07/22/18 171 lb 12.8 oz (77.9 kg)  06/29/18 171 lb 15.3 oz (78 kg)  06/21/18 172 lb (78 kg)     Other studies Reviewed: Additional studies/ records that were reviewed today include: Office notes, hospital records and testing.  ASSESSMENT AND PLAN:  1.  Angina, s/p DES x 2 LAD for ISR -He is worried about being on Brilinta.  The cost is a problem but mainly it is the GI issues that bother him.  He previously tolerated Plavix, and because his GERD is so  severe in the evenings and he  is unable to eat very much, he does not know if he will be able to continue taking the Brilinta. -Dr Bronson Ing to address changing Brilinta to Plavix -Dr. Quillian Quince follows his lipids and other labs  2. GI issues -They are significant.  He is on Protonix daily. - he is requested to follow-up with his GI doctor to make sure that no medication adjustments are needed because of his recent stent  Current medicines are reviewed at length with the patient today.  The patient has concerns regarding medicines.  Concerns were addressed  The following changes have been made: None  Labs/ tests ordered today include:  No orders of the defined types were placed in this encounter.    Disposition:   FU with Kate Sable, MD  Signed, Rosaria Ferries, PA-C  07/22/2018 2:44 PM    [place] Phone: [phone]; Fax: [phone]  This note was written with the assistance of speech recognition software.  Please excuse any transcriptional errors.

## 2018-10-28 ENCOUNTER — Telehealth: Payer: Self-pay | Admitting: Cardiovascular Disease

## 2018-10-28 NOTE — Telephone Encounter (Signed)
Called patient, but, VM box was full and could not leave a msg.

## 2018-10-29 ENCOUNTER — Ambulatory Visit: Payer: Medicare Other | Admitting: Cardiovascular Disease

## 2018-10-29 ENCOUNTER — Encounter: Payer: Self-pay | Admitting: Cardiovascular Disease

## 2018-10-29 VITALS — BP 122/58 | HR 83 | Ht 67.0 in | Wt 174.0 lb

## 2018-10-29 DIAGNOSIS — I25118 Atherosclerotic heart disease of native coronary artery with other forms of angina pectoris: Secondary | ICD-10-CM | POA: Diagnosis not present

## 2018-10-29 DIAGNOSIS — I10 Essential (primary) hypertension: Secondary | ICD-10-CM | POA: Diagnosis not present

## 2018-10-29 DIAGNOSIS — R0602 Shortness of breath: Secondary | ICD-10-CM

## 2018-10-29 DIAGNOSIS — Z955 Presence of coronary angioplasty implant and graft: Secondary | ICD-10-CM | POA: Diagnosis not present

## 2018-10-29 DIAGNOSIS — E785 Hyperlipidemia, unspecified: Secondary | ICD-10-CM

## 2018-10-29 DIAGNOSIS — R011 Cardiac murmur, unspecified: Secondary | ICD-10-CM

## 2018-10-29 MED ORDER — CLOPIDOGREL BISULFATE 75 MG PO TABS: 75.0000 mg | ORAL_TABLET | Freq: Every day | ORAL | 6 refills | Status: DC

## 2018-10-29 NOTE — Patient Instructions (Signed)
Medication Instructions:   Stop Brilinta.   Begin Plavix 75mg  daily.  Continue all other medications.    Labwork: none  Testing/Procedures: none  Follow-Up: Your physician wants you to follow up in: 6 months.  You will receive a reminder letter in the mail one-two months in advance.  If you don't receive a letter, please call our office to schedule the follow up appointment   Any Other Special Instructions Will Be Listed Below (If Applicable).  If you need a refill on your cardiac medications before your next appointment, please call your pharmacy.

## 2018-10-29 NOTE — Progress Notes (Signed)
SUBJECTIVE: The patient presents for routine follow-up.  He initially underwent drug-eluting stent placement to the LAD on 06/07/2004.  Due to progressive exertional fatigue and dyspnea, I arrange for cardiac catheterization.  This was performed on 06/28/2018 and showed severe single-vessel coronary artery disease involving the proximal and distal edges/segments of previously placed proximal LAD stent.  There was mild nonobstructive disease in the mid RCA.  LV systolic function and cardiac output were normal.  He underwent drug-eluting stent placement x2 to the LAD overlapping the previously placed stent.  He denies exertional chest pain.  He become short of breath after taking Brilinta and also develops a headache.  He would like to switch to Plavix.  He has occasional dizziness.  1 day he took Flomax twice and blood pressure dropped to 81/37.      Review of Systems: As per "subjective", otherwise negative.  Allergies  Allergen Reactions  . Eggs Or Egg-Derived Products   . Flagyl [Metronidazole]   . Penicillins Rash    Has patient had a PCN reaction causing immediate rash, facial/tongue/throat swelling, SOB or lightheadedness with hypotension: Yes Has patient had a PCN reaction causing severe rash involving mucus membranes or skin necrosis: No Has patient had a PCN reaction that required hospitalization: No Has patient had a PCN reaction occurring within the last 10 years: No If all of the above answers are "NO", then may proceed with Cephalosporin use.'    Current Outpatient Medications  Medication Sig Dispense Refill  . aspirin EC 81 MG tablet Take 81 mg by mouth daily.    Marland Kitchen atorvastatin (LIPITOR) 10 MG tablet Take 10 mg by mouth at bedtime.     . Calcium-Magnesium-Vitamin D (CALCIUM 500 PO) Take 1 tablet by mouth 2 (two) times daily.    . Cholecalciferol (VITAMIN D3) 5000 units TABS Take 5,000 Units by mouth daily.     . cyclobenzaprine (FLEXERIL) 10 MG tablet Take 10 mg  by mouth at bedtime as needed for muscle spasms.     . fluticasone (FLONASE) 50 MCG/ACT nasal spray Place 2 sprays into both nostrils daily.    . folic acid (FOLVITE) 1 MG tablet Take 1 mg by mouth daily.    Marland Kitchen gabapentin (NEURONTIN) 600 MG tablet Take 600 mg by mouth 3 (three) times daily.    Marland Kitchen losartan-hydrochlorothiazide (HYZAAR) 100-12.5 MG tablet Take 1 tablet by mouth daily.     . methotrexate 2.5 MG tablet Take 20 mg by mouth every Saturday.     . nitroGLYCERIN (NITROSTAT) 0.4 MG SL tablet Place 1 tablet (0.4 mg total) under the tongue every 5 (five) minutes as needed for chest pain. 25 tablet 3  . Omega-3 Fatty Acids (FISH OIL) 1000 MG CAPS Take 2,000 mg by mouth 2 (two) times daily.     . pantoprazole (PROTONIX) 40 MG tablet Take 40 mg by mouth daily.    . polyethylene glycol (MIRALAX / GLYCOLAX) packet Take 17 g by mouth daily.    . tamsulosin (FLOMAX) 0.4 MG CAPS capsule Take 0.4 mg by mouth daily.    . ticagrelor (BRILINTA) 90 MG TABS tablet Take 1 tablet (90 mg total) by mouth 2 (two) times daily. 180 tablet 2  . valACYclovir (VALTREX) 500 MG tablet Take 2,000 mg by mouth 2 (two) times daily as needed (fever blisters).      No current facility-administered medications for this visit.     Past Medical History:  Diagnosis Date  . ASCVD (arteriosclerotic cardiovascular  disease)   . Chest pain   . Collagen vascular disease ([place])   . Complication of anesthesia    "I had local anesthesia , and I went out for 3 hrs "  . 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 Coronary artery disease   . Diabetes mellitus without complication ([place])   . Dyspnea   . GERD (gastroesophageal reflux disease)   . Hypertension   . Palpitations     Past Surgical History:  Procedure Laterality Date  . APPENDECTOMY  1987  . CARDIAC CATHETERIZATION    . CATARACT EXTRACTION  11/2009 & 12/2015  . CORONARY STENT INTERVENTION  06/28/2018  . CORONARY STENT INTERVENTION N/A 06/28/2018   Procedure: CORONARY STENT  INTERVENTION;  Surgeon: Nelva Bush, MD;  Location: [facility];  Service: Cardiovascular;  Laterality: N/A;  . GALLBLADDER SURGERY  1989  . INTRAVASCULAR ULTRASOUND/IVUS N/A 06/28/2018   Procedure: Intravascular Ultrasound/IVUS;  Surgeon: Nelva Bush, MD;  Location: [facility];  Service: Cardiovascular;  Laterality: N/A;  . NASAL SINUS SURGERY  11/21/1987  . RIGHT/LEFT HEART CATH AND CORONARY ANGIOGRAPHY N/A 06/28/2018   Procedure: RIGHT/LEFT HEART CATH AND CORONARY ANGIOGRAPHY;  Surgeon: Nelva Bush, MD;  Location: [facility];  Service: Cardiovascular;  Laterality: N/A;  . SHOULDER SURGERY  2007  . stent in lad  06/07/2004  . TONSILLECTOMY  1971  . trigerfinger  1990    Social History   Socioeconomic History  . Marital status: Married    Spouse name: Not on file  . Number of children: Not on file  . Years of education: Not on file  . Highest education level: Not on file  Occupational History  . Not on file  Social Needs  . Financial resource strain: Not on file  . Food insecurity:    Worry: Not on file    Inability: Not on file  . Transportation needs:    Medical: Not on file    Non-medical: Not on file  Tobacco Use  . Smoking status: Never Smoker  . Smokeless tobacco: Never Used  Substance and Sexual Activity  . Alcohol use: Never    Frequency: Never  . Drug use: Never  . Sexual activity: Not on file  Lifestyle  . Physical activity:    Days per week: Not on file    Minutes per session: Not on file  . Stress: Not on file  Relationships  . Social connections:    Talks on phone: Not on file    Gets together: Not on file    Attends religious service: Not on file    Active member of club or organization: Not on file    Attends meetings of clubs or organizations: Not on file    Relationship status: Not on file  . Intimate partner violence:    Fear of current or ex partner: Not on file    Emotionally abused: Not on file     Physically abused: Not on file    Forced sexual activity: Not on file  Other Topics Concern  . Not on file  Social History Narrative  . Not on file     Vitals:   10/29/18 1026  BP: (!) 122/58  Pulse: 83  SpO2: 98%  Weight: 174 lb (78.9 kg)  Height: 5\' 7"  (1.702 m)    Wt Readings from Last 3 Encounters:  10/29/18 174 lb (78.9 kg)  07/22/18 171 lb 12.8 oz (77.9 kg)  06/29/18 171 lb 15.3 oz (  78 kg)     PHYSICAL EXAM General: NAD HEENT: Normal. Neck: No JVD, no thyromegaly. Lungs: Clear to auscultation bilaterally with normal respiratory effort. CV: Regular rate and rhythm, normal S1/S2, no A3/F5, 2/6 systolicmurmurover right upper sternal border. No pretibial or periankle edema.  No carotid bruit.   Abdomen: Soft, nontender, no distention.  Neurologic: Alert and oriented.  Psych: Normal affect. Skin: Normal. Musculoskeletal: No gross deformities.    ECG: Reviewed above under Subjective   Labs: Lab Results  Component Value Date/Time   K 3.9 06/29/2018 02:47 AM   BUN 8 06/29/2018 02:47 AM   CREATININE 1.09 06/29/2018 02:47 AM   HGB 11.6 (L) 06/29/2018 02:47 AM     Lipids: Lab Results  Component Value Date/Time   LDLCALC 56 06/29/2018 02:47 AM   CHOL 115 06/29/2018 02:47 AM   TRIG 121 06/29/2018 02:47 AM   HDL 35 (L) 06/29/2018 02:47 AM       ASSESSMENT AND PLAN: 1.  Coronary artery disease: Symptomatically stable.  Drug-eluting stent placement x2 to the LAD in September 2019.  Continue dual antiplatelet therapy for a minimum of 1 year.  Continue atorvastatin.  2.  Hyperlipidemia: LDL 56 on 06/29/2018.  Continue atorvastatin 10 mg.  3.  Hypertension: Blood pressure is normal.  No changes.  4.  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   Disposition: Follow up 6 months   Kate Sable, M.D., F.A.C.C.

## 2018-10-30 ENCOUNTER — Other Ambulatory Visit: Payer: Self-pay | Admitting: Cardiovascular Disease

## 2018-12-18 DIAGNOSIS — E119 Type 2 diabetes mellitus without complications: Secondary | ICD-10-CM | POA: Diagnosis not present

## 2018-12-21 DIAGNOSIS — E119 Type 2 diabetes mellitus without complications: Secondary | ICD-10-CM | POA: Diagnosis not present

## 2018-12-27 DIAGNOSIS — Z6828 Body mass index (BMI) 28.0-28.9, adult: Secondary | ICD-10-CM | POA: Diagnosis not present

## 2018-12-27 DIAGNOSIS — J329 Chronic sinusitis, unspecified: Secondary | ICD-10-CM | POA: Diagnosis not present

## 2019-03-03 ENCOUNTER — Encounter: Payer: Self-pay | Admitting: Cardiovascular Disease

## 2019-03-03 DIAGNOSIS — E871 Hypo-osmolality and hyponatremia: Secondary | ICD-10-CM | POA: Diagnosis not present

## 2019-03-03 DIAGNOSIS — E1165 Type 2 diabetes mellitus with hyperglycemia: Secondary | ICD-10-CM | POA: Diagnosis not present

## 2019-03-03 DIAGNOSIS — E1142 Type 2 diabetes mellitus with diabetic polyneuropathy: Secondary | ICD-10-CM | POA: Diagnosis not present

## 2019-03-03 DIAGNOSIS — J449 Chronic obstructive pulmonary disease, unspecified: Secondary | ICD-10-CM | POA: Diagnosis not present

## 2019-03-03 DIAGNOSIS — K210 Gastro-esophageal reflux disease with esophagitis: Secondary | ICD-10-CM | POA: Diagnosis not present

## 2019-03-03 DIAGNOSIS — I10 Essential (primary) hypertension: Secondary | ICD-10-CM | POA: Diagnosis not present

## 2019-03-03 DIAGNOSIS — E782 Mixed hyperlipidemia: Secondary | ICD-10-CM | POA: Diagnosis not present

## 2019-03-08 DIAGNOSIS — Z6827 Body mass index (BMI) 27.0-27.9, adult: Secondary | ICD-10-CM | POA: Diagnosis not present

## 2019-03-08 DIAGNOSIS — J449 Chronic obstructive pulmonary disease, unspecified: Secondary | ICD-10-CM | POA: Diagnosis not present

## 2019-03-08 DIAGNOSIS — I10 Essential (primary) hypertension: Secondary | ICD-10-CM | POA: Diagnosis not present

## 2019-03-08 DIAGNOSIS — M0540 Rheumatoid myopathy with rheumatoid arthritis of unspecified site: Secondary | ICD-10-CM | POA: Diagnosis not present

## 2019-03-08 DIAGNOSIS — M320 Drug-induced systemic lupus erythematosus: Secondary | ICD-10-CM | POA: Diagnosis not present

## 2019-03-08 DIAGNOSIS — I2510 Atherosclerotic heart disease of native coronary artery without angina pectoris: Secondary | ICD-10-CM | POA: Diagnosis not present

## 2019-03-08 DIAGNOSIS — N401 Enlarged prostate with lower urinary tract symptoms: Secondary | ICD-10-CM | POA: Diagnosis not present

## 2019-03-08 DIAGNOSIS — E1142 Type 2 diabetes mellitus with diabetic polyneuropathy: Secondary | ICD-10-CM | POA: Diagnosis not present

## 2019-03-15 DIAGNOSIS — E119 Type 2 diabetes mellitus without complications: Secondary | ICD-10-CM | POA: Diagnosis not present

## 2019-03-29 DIAGNOSIS — Z6827 Body mass index (BMI) 27.0-27.9, adult: Secondary | ICD-10-CM | POA: Diagnosis not present

## 2019-03-29 DIAGNOSIS — L03115 Cellulitis of right lower limb: Secondary | ICD-10-CM | POA: Diagnosis not present

## 2019-03-29 DIAGNOSIS — S80261A Insect bite (nonvenomous), right knee, initial encounter: Secondary | ICD-10-CM | POA: Diagnosis not present

## 2019-04-19 ENCOUNTER — Other Ambulatory Visit: Payer: Self-pay | Admitting: Cardiovascular Disease

## 2019-05-03 ENCOUNTER — Telehealth: Payer: Self-pay | Admitting: Cardiovascular Disease

## 2019-05-03 NOTE — Telephone Encounter (Signed)
Virtual Visit Pre-Appointment Phone Call  "(Name), I am calling you today to discuss your upcoming appointment. We are currently trying to limit exposure to the virus that causes COVID-19 by seeing patients at home rather than in the office."  1. "What is the BEST phone number to call the day of the visit?" - include this in appointment notes  2. Do you have or have access to (through a family member/friend) a smartphone with video capability that we can use for your visit?" a. If yes - list this number in appt notes as cell (if different from BEST phone #) and list the appointment type as a VIDEO visit in appointment notes b. If no - list the appointment type as a PHONE visit in appointment notes  3. Confirm consent - "In the setting of the current Covid19 crisis, you are scheduled for a (phone or video) visit with your provider on (date) at (time).  Just as we do with many in-office visits, in order for you to participate in this visit, we must obtain consent.  If you'd like, I can send this to your mychart (if signed up) or email for you to review.  Otherwise, I can obtain your verbal consent now.  All virtual visits are billed to your insurance company just like a normal visit would be.  By agreeing to a virtual visit, we'd like you to understand that the technology does not allow for your provider to perform an examination, and thus may limit your provider's ability to fully assess your condition. If your provider identifies any concerns that need to be evaluated in person, we will make arrangements to do so.  Finally, though the technology is pretty good, we cannot assure that it will always work on either your or our end, and in the setting of a video visit, we may have to convert it to a phone-only visit.  In either situation, we cannot ensure that we have a secure connection.  Are you willing to proceed?" STAFF: Did the patient verbally acknowledge consent to telehealth visit? Document  YES/NO here: yes  4. Advise patient to be prepared - "Two hours prior to your appointment, go ahead and check your blood pressure, pulse, oxygen saturation, and your weight (if you have the equipment to check those) and write them all down. When your visit starts, your provider will ask you for this information. If you have an Apple Watch or Kardia device, please plan to have heart rate information ready on the day of your appointment. Please have a pen and paper handy nearby the day of the visit as well."  5. Give patient instructions for MyChart download to smartphone OR Doximity/Doxy.me as below if video visit (depending on what platform provider is using)  6. Inform patient they will receive a phone call 15 minutes prior to their appointment time (may be from unknown caller ID) so they should be prepared to answer    Lonnie Duffy has been deemed a candidate for a follow-up tele-health visit to limit community exposure during the Covid-19 pandemic. I spoke with the patient via phone to ensure availability of phone/video source, confirm preferred email & phone number, and discuss instructions and expectations.  I reminded Lonnie Duffy to be prepared with any vital sign and/or heart rhythm information that could potentially be obtained via home monitoring, at the time of his visit. I reminded Lonnie Duffy to expect a phone call prior to  his visit.  Lonnie Duffy 05/03/2019 3:33 PM   INSTRUCTIONS FOR DOWNLOADING THE MYCHART APP TO SMARTPHONE  - The patient must first make sure to have activated MyChart and know their login information - If Apple, go to [employer] and type in MyChart in the search bar and download the app. If Android, ask patient to go to [employer] and type in [place] in the search bar and download the app. The app is free but as with any other app downloads, their phone may require them to verify saved payment information or Apple/Android  password.  - The patient will need to then log into the app with their MyChart username and password, and select [facility] as their healthcare provider to link the account. When it is time for your visit, go to the MyChart app, find appointments, and click Begin Video Visit. Be sure to Select Allow for your device to access the Microphone and Camera for your visit. You will then be connected, and your provider will be with you shortly.  **If they have any issues connecting, or need assistance please contact MyChart service desk [phone])**  **If using a computer, in order to ensure the best quality for their visit they will need to use either of the following Internet Browsers: [employer], or Google Chrome**  IF USING DOXIMITY or DOXY.ME - The patient will receive a link just prior to their visit by text.     FULL LENGTH CONSENT FOR TELE-HEALTH VISIT   I hereby voluntarily request, consent and authorize [facility] and its employed or contracted physicians, physician assistants, nurse practitioners or other licensed health care professionals (the Practitioner), to provide me with telemedicine health care services (the Services") as deemed necessary by the treating Practitioner. I acknowledge and consent to receive the Services by the Practitioner via telemedicine. I understand that the telemedicine visit will involve communicating with the Practitioner through live audiovisual communication technology and the disclosure of certain medical information by electronic transmission. I acknowledge that I have been given the opportunity to request an in-person assessment or other available alternative prior to the telemedicine visit and am voluntarily participating in the telemedicine visit.  I understand that I have the right to withhold or withdraw my consent to the use of telemedicine in the course of my care at any time, without affecting my right to future care or treatment,  and that the Practitioner or I may terminate the telemedicine visit at any time. I understand that I have the right to inspect all information obtained and/or recorded in the course of the telemedicine visit and may receive copies of available information for a reasonable fee.  I understand that some of the potential risks of receiving the Services via telemedicine include:   Delay or interruption in medical evaluation due to technological equipment failure or disruption;  Information transmitted may not be sufficient (e.g. poor resolution of images) to allow for appropriate medical decision making by the Practitioner; and/or   In rare instances, security protocols could fail, causing a breach of personal health information.  Furthermore, I acknowledge that it is my responsibility to provide information about my medical history, conditions and care that is complete and accurate to the best of my ability. I acknowledge that Practitioner's advice, recommendations, and/or decision may be based on factors not within their control, such as incomplete or inaccurate data provided by me or distortions of diagnostic images or specimens that may result from electronic transmissions. I  understand that the practice of medicine is not an exact science and that Practitioner makes no warranties or guarantees regarding treatment outcomes. I acknowledge that I will receive a copy of this consent concurrently upon execution via email to the email address I last provided but may also request a printed copy by calling the office of [facility].    I understand that my insurance will be billed for this visit.   I have read or had this consent read to me.  I understand the contents of this consent, which adequately explains the benefits and risks of the Services being provided via telemedicine.   I have been provided ample opportunity to ask questions regarding this consent and the Services and have had my questions  answered to my satisfaction.  I give my informed consent for the services to be provided through the use of telemedicine in my medical care  By participating in this telemedicine visit I agree to the above.

## 2019-05-10 ENCOUNTER — Telehealth (INDEPENDENT_AMBULATORY_CARE_PROVIDER_SITE_OTHER): Payer: Medicare HMO | Admitting: Cardiovascular Disease

## 2019-05-10 ENCOUNTER — Encounter: Payer: Self-pay | Admitting: Cardiovascular Disease

## 2019-05-10 VITALS — BP 128/57 | HR 81 | Ht 67.0 in | Wt 172.4 lb

## 2019-05-10 DIAGNOSIS — I25118 Atherosclerotic heart disease of native coronary artery with other forms of angina pectoris: Secondary | ICD-10-CM | POA: Diagnosis not present

## 2019-05-10 DIAGNOSIS — Z955 Presence of coronary angioplasty implant and graft: Secondary | ICD-10-CM

## 2019-05-10 DIAGNOSIS — I959 Hypotension, unspecified: Secondary | ICD-10-CM

## 2019-05-10 DIAGNOSIS — E785 Hyperlipidemia, unspecified: Secondary | ICD-10-CM

## 2019-05-10 DIAGNOSIS — I10 Essential (primary) hypertension: Secondary | ICD-10-CM

## 2019-05-10 DIAGNOSIS — R011 Cardiac murmur, unspecified: Secondary | ICD-10-CM

## 2019-05-10 DIAGNOSIS — R0602 Shortness of breath: Secondary | ICD-10-CM

## 2019-05-10 MED ORDER — LOSARTAN POTASSIUM-HCTZ 100-12.5 MG PO TABS: 0.5000 | ORAL_TABLET | Freq: Every day | ORAL | Status: DC

## 2019-05-10 NOTE — Patient Instructions (Signed)
Medication Instructions:   Decrease your Hyzaar to 1/2 tab daily.  Continue all other medications.    Labwork: none  Testing/Procedures: none  Follow-Up: Your physician wants you to follow up in: 6 months.  You will receive a reminder letter in the mail one-two months in advance.  If you don't receive a letter, please call our office to schedule the follow up appointment   Any Other Special Instructions Will Be Listed Below (If Applicable).  If you need a refill on your cardiac medications before your next appointment, please call your pharmacy.

## 2019-05-10 NOTE — Addendum Note (Signed)
Addended by: Laurine Blazer on: 05/10/2019 10:46 AM   Modules accepted: Orders

## 2019-05-10 NOTE — Progress Notes (Signed)
Virtual Visit via Telephone Note   This visit type was conducted due to national recommendations for restrictions regarding the COVID-19 Pandemic (e.g. social distancing) in an effort to limit this patient's exposure and mitigate transmission in our community.  Due to his co-morbid illnesses, this patient is at least at moderate risk for complications without adequate follow up.  This format is felt to be most appropriate for this patient at this time.  The patient did not have access to video technology/had technical difficulties with video requiring transitioning to audio format only (telephone).  All issues noted in this document were discussed and addressed.  No physical exam could be performed with this format.  Please refer to the patient's chart for his  consent to telehealth for [facility].   Date:  05/10/2019   ID:  Lonnie Duffy, DOB 15-Mar-1941, MRN [id number]  Patient Location: Home Provider Location: Office  PCP:  Caryl Bis, MD  Cardiologist:  Kate Sable, MD  Electrophysiologist:  None   Evaluation Performed:  Follow-Up Visit  Chief Complaint:  CAD  History of Present Illness:    Lonnie Duffy is a 78 y.o. male with coronary artery disease.  He underwent cardiac catheterization on 06/28/2018 and had 2 drug-eluting stents to the LAD overlapping the previously placed stent.  He's been getting fatigued. He takes gabapentin 600 mg tid and wonders if it's due to this. He's had 2 or 3 episodes of low BP down to 80/40 a few days ago. He takes Flomax at night and Hyzaar every morning.  He has some shortness of breath associated with low BP. He denies chest pain.  The patient does not have symptoms concerning for COVID-19 infection (fever, chills, cough, or new shortness of breath).    Past Medical History:  Diagnosis Date  . ASCVD (arteriosclerotic cardiovascular disease)   . Chest pain   . Collagen vascular disease ([place])   . Complication of anesthesia    "I had local anesthesia , and I went out for 3 hrs "  . 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 Coronary artery disease   . Diabetes mellitus without complication ([place])   . Dyspnea   . GERD (gastroesophageal reflux disease)   . Hypertension   . Palpitations    Past Surgical History:  Procedure Laterality Date  . APPENDECTOMY  1987  . CARDIAC CATHETERIZATION    . CATARACT EXTRACTION  11/2009 & 12/2015  . CORONARY STENT INTERVENTION  06/28/2018  . CORONARY STENT INTERVENTION N/A 06/28/2018   Procedure: CORONARY STENT INTERVENTION;  Surgeon: Nelva Bush, MD;  Location: [facility];  Service: Cardiovascular;  Laterality: N/A;  . GALLBLADDER SURGERY  1989  . INTRAVASCULAR ULTRASOUND/IVUS N/A 06/28/2018   Procedure: Intravascular Ultrasound/IVUS;  Surgeon: Nelva Bush, MD;  Location: [facility];  Service: Cardiovascular;  Laterality: N/A;  . NASAL SINUS SURGERY  11/21/1987  . RIGHT/LEFT HEART CATH AND CORONARY ANGIOGRAPHY N/A 06/28/2018   Procedure: RIGHT/LEFT HEART CATH AND CORONARY ANGIOGRAPHY;  Surgeon: Nelva Bush, MD;  Location: [facility];  Service: Cardiovascular;  Laterality: N/A;  . SHOULDER SURGERY  2007  . stent in lad  06/07/2004  . TONSILLECTOMY  1971  . trigerfinger  1990     Current Meds  Medication Sig  . aspirin EC 81 MG tablet Take 81 mg by mouth daily.  Marland Kitchen atorvastatin (LIPITOR) 10 MG tablet Take 10 mg by mouth at bedtime.   . Calcium-Magnesium-Vitamin D (CALCIUM 500 PO)  Take 1 tablet by mouth 2 (two) times daily.  . Cholecalciferol (VITAMIN D3) 5000 units TABS Take 5,000 Units by mouth daily.   . clopidogrel (PLAVIX) 75 MG tablet TAKE 1 TABLET BY MOUTH EVERY DAY  . cyclobenzaprine (FLEXERIL) 10 MG tablet Take 10 mg by mouth at bedtime as needed for muscle spasms.   . fluticasone (FLONASE) 50 MCG/ACT nasal spray Place 2 sprays into both nostrils daily.  . folic acid (FOLVITE) 1 MG tablet Take 1 mg by mouth daily.  Marland Kitchen gabapentin  (NEURONTIN) 600 MG tablet Take 600 mg by mouth 3 (three) times daily.  Marland Kitchen losartan-hydrochlorothiazide (HYZAAR) 100-12.5 MG tablet Take 1 tablet by mouth daily.   . methotrexate 2.5 MG tablet Take 20 mg by mouth every Saturday.   . nitroGLYCERIN (NITROSTAT) 0.4 MG SL tablet PLACE 1 TABLET (0.4 MG TOTAL) UNDER THE TONGUE EVERY 5 (FIVE) MINUTES AS NEEDED FOR CHEST PAIN.  Marland Kitchen Omega-3 Fatty Acids (FISH OIL) 1000 MG CAPS Take 2,000 mg by mouth 2 (two) times daily.   . pantoprazole (PROTONIX) 40 MG tablet Take 40 mg by mouth daily.  . polyethylene glycol (MIRALAX / GLYCOLAX) packet Take 17 g by mouth daily.  . tamsulosin (FLOMAX) 0.4 MG CAPS capsule Take 0.4 mg by mouth daily.  . valACYclovir (VALTREX) 500 MG tablet Take 2,000 mg by mouth 2 (two) times daily as needed (fever blisters).      Allergies:   Eggs or egg-derived products, Flagyl [metronidazole], and Penicillins   Social History   Tobacco Use  . Smoking status: Never Smoker  . Smokeless tobacco: Never Used  Substance Use Topics  . Alcohol use: Never    Frequency: Never  . Drug use: Never     Family Hx: The patient's family history includes Diabetes in his father; Heart attack in his father and mother.  ROS:   Please see the history of present illness.     All other systems reviewed and are negative.   Prior CV studies:   The following studies were reviewed today:  See above  Labs/Other Tests and Data Reviewed:    EKG:  No ECG reviewed.  Recent Labs: 06/29/2018: BUN 8; Creatinine, Ser 1.09; Hemoglobin 11.6; Platelets 237; Potassium 3.9; Sodium 138   Recent Lipid Panel Lab Results  Component Value Date/Time   CHOL 115 06/29/2018 02:47 AM   TRIG 121 06/29/2018 02:47 AM   HDL 35 (L) 06/29/2018 02:47 AM   CHOLHDL 3.3 06/29/2018 02:47 AM   LDLCALC 56 06/29/2018 02:47 AM    Wt Readings from Last 3 Encounters:  05/10/19 172 lb 6.4 oz (78.2 kg)  10/29/18 174 lb (78.9 kg)  07/22/18 171 lb 12.8 oz (77.9 kg)      Objective:    Vital Signs:  BP (!) 128/57   Pulse 81   Ht 5\' 7"  (1.702 m)   Wt 172 lb 6.4 oz (78.2 kg)   BMI 27.00 kg/m    VITAL SIGNS:  reviewed  ASSESSMENT & PLAN:    1.  Coronary artery disease: Symptomatically stable.  Drug-eluting stent placement x2 to the LAD in September 2019.  Continue dual antiplatelet therapy indefinitely as per interventional cardiology recommendations due to elevated risk for late stent thrombosis. Continue atorvastatin.  2.  Hyperlipidemia: LDL 56 on 06/29/2018.  Continue atorvastatin 10 mg.  I will obtain a copy of most recent lipids from PCP.  3.  Hypertension: Blood pressure is normal.    However, he has had episodic hypotension accompanied by  shortness of breath and fatigue.  I asked him to reduce the dose of Hyzaar by half and continue to monitor his blood pressures.  4.  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   COVID-19 Education: The signs and symptoms of COVID-19 were discussed with the patient and how to seek care for testing (follow up with PCP or arrange E-visit).  The importance of social distancing was discussed today.  Time:   Today, I have spent 10 minutes with the patient with telehealth technology discussing the above problems.     Medication Adjustments/Labs and Tests Ordered: Current medicines are reviewed at length with the patient today.  Concerns regarding medicines are outlined above.   Tests Ordered: No orders of the defined types were placed in this encounter.   Medication Changes: No orders of the defined types were placed in this encounter.   Follow Up:  Virtual Visit or In Person in 6 months  Signed, Kate Sable, MD  05/10/2019 10:07 AM    [place]

## 2019-05-13 ENCOUNTER — Encounter: Payer: Self-pay | Admitting: *Deleted

## 2019-08-15 ENCOUNTER — Telehealth: Payer: Self-pay | Admitting: *Deleted

## 2019-08-15 NOTE — Telephone Encounter (Signed)
Received fax from [place] at [facility] - patient recently seen by Dr. Adelina Mings to set up colonoscopy.  Patient currently on Plavix & Dr. Rocco Serene is asking if Plavix can be held 7 days prior to procedure.  Dr. Bronson Ing is out of office this week.

## 2019-08-16 NOTE — Telephone Encounter (Signed)
Ok to hold plavix for procedure, resume after procedure   J Matthias Bogus MD

## 2019-08-16 NOTE — Telephone Encounter (Signed)
Will fwd to Dr. Harl Bowie as Dr. Bronson Ing is out of the office this week.

## 2019-08-16 NOTE — Telephone Encounter (Signed)
Pharmacy is not allowed to recommend on holding anti-platelet medications.  You can send this to the pre-op pool (P CV DIV PREOP) or ask another of the MDs there.  Sorry.

## 2019-08-16 NOTE — Telephone Encounter (Signed)
Noted.  Will fax back to [facility].

## 2019-09-24 ENCOUNTER — Other Ambulatory Visit: Payer: Self-pay | Admitting: Cardiovascular Disease

## 2020-01-19 ENCOUNTER — Ambulatory Visit: Payer: Medicare HMO | Admitting: Cardiovascular Disease

## 2020-01-19 ENCOUNTER — Other Ambulatory Visit: Payer: Self-pay

## 2020-01-19 ENCOUNTER — Encounter: Payer: Self-pay | Admitting: Cardiovascular Disease

## 2020-01-19 VITALS — BP 118/60 | HR 98 | Ht 66.0 in | Wt 175.0 lb

## 2020-01-19 DIAGNOSIS — I10 Essential (primary) hypertension: Secondary | ICD-10-CM

## 2020-01-19 DIAGNOSIS — C9210 Chronic myeloid leukemia, BCR/ABL-positive, not having achieved remission: Secondary | ICD-10-CM

## 2020-01-19 DIAGNOSIS — E785 Hyperlipidemia, unspecified: Secondary | ICD-10-CM

## 2020-01-19 DIAGNOSIS — Z955 Presence of coronary angioplasty implant and graft: Secondary | ICD-10-CM | POA: Diagnosis not present

## 2020-01-19 DIAGNOSIS — I25118 Atherosclerotic heart disease of native coronary artery with other forms of angina pectoris: Secondary | ICD-10-CM

## 2020-01-19 DIAGNOSIS — R011 Cardiac murmur, unspecified: Secondary | ICD-10-CM

## 2020-01-19 MED ORDER — NITROGLYCERIN 0.4 MG SL SUBL: 0.4000 mg | SUBLINGUAL_TABLET | SUBLINGUAL | 3 refills | Status: DC | PRN

## 2020-01-19 NOTE — Patient Instructions (Signed)
Your physician wants you to follow-up in: 6 MONTHS WITH ANDY QUINN, NP  You will receive a reminder letter in the mail two months in advance. If you don't receive a letter, please call our office to schedule the follow-up appointment.  Your physician recommends that you continue on your current medications as directed. Please refer to the Current Medication list given to you today.  Thank you for choosing [facility]!!    

## 2020-01-19 NOTE — Progress Notes (Signed)
SUBJECTIVE: Lonnie Duffy is a 79 y.o. male with coronary artery disease.  He underwent cardiac catheterization on 06/28/2018 and had 2 drug-eluting stents to the LAD overlapping the previously placed stent.  He was diagnosed with CML within the past several months and sees hematology oncology at [facility].  He feels fatigued.  He has occasional shortness of breath.  He recently pulled a chest wall muscle on the left side of his chest after lifting something heavy.  I reviewed labs dated 12/15/2019: White blood cells 45, hemoglobin 13.8, platelets 600.  He has received both doses of the COVID-19 vaccine.  I personally reviewed the ECG performed today which demonstrates sinus rhythm with nonspecific T wave abnormalities.    Review of Systems: As per "subjective", otherwise negative.  Allergies  Allergen Reactions  . Eggs Or Egg-Derived Products   . Flagyl [Metronidazole]   . Penicillins Rash    Has patient had a PCN reaction causing immediate rash, facial/tongue/throat swelling, SOB or lightheadedness with hypotension: Yes Has patient had a PCN reaction causing severe rash involving mucus membranes or skin necrosis: No Has patient had a PCN reaction that required hospitalization: No Has patient had a PCN reaction occurring within the last 10 years: No If all of the above answers are "NO", then may proceed with Cephalosporin use.'    Current Outpatient Medications  Medication Sig Dispense Refill  . aspirin EC 81 MG tablet Take 81 mg by mouth daily.    Marland Kitchen atorvastatin (LIPITOR) 10 MG tablet Take 10 mg by mouth at bedtime.     . Calcium-Magnesium-Vitamin D (CALCIUM 500 PO) Take 1 tablet by mouth 2 (two) times daily.    . Cholecalciferol (VITAMIN D3) 5000 units TABS Take 5,000 Units by mouth daily.     . clopidogrel (PLAVIX) 75 MG tablet TAKE 1 TABLET BY MOUTH EVERY DAY 90 tablet 1  . cyclobenzaprine (FLEXERIL) 10 MG tablet Take 10 mg by mouth at bedtime as needed for muscle  spasms.     . fluticasone (FLONASE) 50 MCG/ACT nasal spray Place 2 sprays into both nostrils daily.    . folic acid (FOLVITE) 1 MG tablet Take 1 mg by mouth daily.    Marland Kitchen gabapentin (NEURONTIN) 600 MG tablet Take 600 mg by mouth 3 (three) times daily.    Marland Kitchen losartan-hydrochlorothiazide (HYZAAR) 100-12.5 MG tablet Take 0.5 tablets by mouth daily.    . methotrexate 2.5 MG tablet Take 20 mg by mouth every Saturday.     . nitroGLYCERIN (NITROSTAT) 0.4 MG SL tablet PLACE 1 TABLET (0.4 MG TOTAL) UNDER THE TONGUE EVERY 5 (FIVE) MINUTES AS NEEDED FOR CHEST PAIN. 25 tablet 3  . Omega-3 Fatty Acids (FISH OIL) 1000 MG CAPS Take 2,000 mg by mouth 2 (two) times daily.     . pantoprazole (PROTONIX) 40 MG tablet Take 40 mg by mouth daily.    . polyethylene glycol (MIRALAX / GLYCOLAX) packet Take 17 g by mouth daily.    . tamsulosin (FLOMAX) 0.4 MG CAPS capsule Take 0.4 mg by mouth daily.    . valACYclovir (VALTREX) 500 MG tablet Take 2,000 mg by mouth 2 (two) times daily as needed (fever blisters).      No current facility-administered medications for this visit.    Past Medical History:  Diagnosis Date  . ASCVD (arteriosclerotic cardiovascular disease)   . Chest pain   . Collagen vascular disease ([place])   . Complication of anesthesia    "I had local  anesthesia , and I went out for 3 hrs "  . 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 Coronary artery disease   . Diabetes mellitus without complication ([place])   . Dyspnea   . GERD (gastroesophageal reflux disease)   . Hypertension   . Palpitations     Past Surgical History:  Procedure Laterality Date  . APPENDECTOMY  1987  . CARDIAC CATHETERIZATION    . CATARACT EXTRACTION  11/2009 & 12/2015  . CORONARY STENT INTERVENTION  06/28/2018  . CORONARY STENT INTERVENTION N/A 06/28/2018   Procedure: CORONARY STENT INTERVENTION;  Surgeon: Nelva Bush, MD;  Location: [facility];  Service: Cardiovascular;  Laterality: N/A;  . GALLBLADDER SURGERY  1989  .  INTRAVASCULAR ULTRASOUND/IVUS N/A 06/28/2018   Procedure: Intravascular Ultrasound/IVUS;  Surgeon: Nelva Bush, MD;  Location: [place] [facility];  Service: Cardiovascular;  Laterality: N/A;  . NASAL SINUS SURGERY  11/21/1987  . RIGHT/LEFT HEART CATH AND CORONARY ANGIOGRAPHY N/A 06/28/2018   Procedure: RIGHT/LEFT HEART CATH AND CORONARY ANGIOGRAPHY;  Surgeon: Nelva Bush, MD;  Location: [place] [facility];  Service: Cardiovascular;  Laterality: N/A;  . SHOULDER SURGERY  2007  . stent in lad  06/07/2004  . TONSILLECTOMY  1971  . trigerfinger  1990    Social History   Socioeconomic History  . Marital status: Married    Spouse name: Not on file  . Number of children: Not on file  . Years of education: Not on file  . Highest education level: Not on file  Occupational History  . Not on file  Tobacco Use  . Smoking status: Never Smoker  . Smokeless tobacco: Never Used  Substance and Sexual Activity  . Alcohol use: Never  . Drug use: Never  . Sexual activity: Not on file  Other Topics Concern  . Not on file  Social History Narrative  . Not on file   Social Determinants of Health   Financial Resource Strain:   . Difficulty of Paying Living Expenses:   Food Insecurity:   . Worried About Charity fundraiser in the Last Year:   . Arboriculturist in the Last Year:   Transportation Needs:   . Film/video editor (Medical):   Marland Kitchen Lack of Transportation (Non-Medical):   Physical Activity:   . Days of Exercise per Week:   . Minutes of Exercise per Session:   Stress:   . Feeling of Stress :   Social Connections:   . Frequency of Communication with Friends and Family:   . Frequency of Social Gatherings with Friends and Family:   . Attends Religious Services:   . Active Member of Clubs or Organizations:   . Attends Archivist Meetings:   Marland Kitchen Marital Status:   Intimate Partner Violence:   . Fear of Current or Ex-Partner:   . Emotionally Abused:   Marland Kitchen Physically  Abused:   . Sexually Abused:      Vitals:   01/19/20 1511  BP: 118/60  Pulse: 98  SpO2: 98%  Weight: 175 lb (79.4 kg)  Height: 5\' 6"  (1.676 m)    Wt Readings from Last 3 Encounters:  01/19/20 175 lb (79.4 kg)  05/10/19 172 lb 6.4 oz (78.2 kg)  10/29/18 174 lb (78.9 kg)     PHYSICAL EXAM General: NAD HEENT: Normal. Neck: No JVD, no thyromegaly. Lungs: Clear to auscultation bilaterally with normal respiratory effort. CV: Regular rate and rhythm, normal S1/S2, no [id number], soft systolic murmur over  RUSB. No pretibial or periankle edema.  No carotid bruit.   Abdomen: Soft, nontender, no distention.  Neurologic: Alert and oriented.  Psych: Normal affect. Skin: Normal. Musculoskeletal: No gross deformities.      Labs: Lab Results  Component Value Date/Time   K 3.9 06/29/2018 02:47 AM   BUN 8 06/29/2018 02:47 AM   CREATININE 1.09 06/29/2018 02:47 AM   HGB 11.6 (L) 06/29/2018 02:47 AM     Lipids: Lab Results  Component Value Date/Time   LDLCALC 56 06/29/2018 02:47 AM   CHOL 115 06/29/2018 02:47 AM   TRIG 121 06/29/2018 02:47 AM   HDL 35 (L) 06/29/2018 02:47 AM       ASSESSMENT AND PLAN:  1. Coronary artery disease: Symptomatically stable. Drug-eluting stent placement x2 to the LAD in September 2019. Continue dual antiplatelet therapy with aspirin and clopidogrel indefinitely as per interventional cardiology recommendations due to elevated risk for late stent thrombosis. Continue atorvastatin.  I will refill nitroglycerin.  2. Hyperlipidemia: LDL 64 on 03/04/2019. Continue atorvastatin 10 mg.   3. Hypertension: Blood pressure is normal.  No changes to therapy.  4. 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  5.  CML: This was diagnosed within the last several months and he follows with hematology oncology at [facility] on 12/15/2019 showed platelets 600,000, hemoglobin 13.8, white blood cells 45,000.  Plans  are for him to start on dasatinib.    Disposition: Follow up 6 months   Kate Sable, M.D., F.A.C.C.

## 2020-01-20 NOTE — Addendum Note (Signed)
Addended by: Julian Hy T on: 01/20/2020 12:50 PM   Modules accepted: Orders

## 2020-04-12 DIAGNOSIS — J189 Pneumonia, unspecified organism: Secondary | ICD-10-CM

## 2020-04-12 HISTORY — DX: Pneumonia, unspecified organism: J18.9

## 2020-07-03 ENCOUNTER — Other Ambulatory Visit: Payer: Self-pay

## 2020-07-03 ENCOUNTER — Encounter: Payer: Self-pay | Admitting: Pulmonary Disease

## 2020-07-03 ENCOUNTER — Ambulatory Visit: Payer: Medicare Other | Admitting: Pulmonary Disease

## 2020-07-03 VITALS — BP 132/64 | HR 111 | Temp 98.3°F | Ht 67.0 in | Wt 181.2 lb

## 2020-07-03 DIAGNOSIS — R0600 Dyspnea, unspecified: Secondary | ICD-10-CM | POA: Diagnosis not present

## 2020-07-03 DIAGNOSIS — J849 Interstitial pulmonary disease, unspecified: Secondary | ICD-10-CM

## 2020-07-03 DIAGNOSIS — R0609 Other forms of dyspnea: Secondary | ICD-10-CM

## 2020-07-03 NOTE — Progress Notes (Signed)
Synopsis: Referred by Gar Ponto for shortness of breath  Subjective:   PATIENT ID: Lonnie Duffy GENDER: male DOB: [birth year], MRN: [id number]   HPI  Chief Complaint  Patient presents with  . Consult    SOB since hospital discharge in July. Lung damage from a medication.   Kenaz Olafson is a 79 year old male, never smoker with history of rheumatoid arthritis and CML who is referred to pulmonary clinic for shortness of breath.  He was diagnosed with rheumatoid arthritis 8 years ago and has been managed on methotrexate and 5mg  of prednisone daily for the joint pain.   The patient was diagnosed with CML in 01/2020 and was started on Dasatinib in 02/2020. By the end of May he was noted to be having increasing shortness of breath and wheezing. A CT chest scan 03/02/20 showed bilateral peripherally predominant patchy areas of ground glass attentuation, interstitial thickening and reticulation with mild traction bronchiectasis. Cardiac echo at this time showed EF 60-65%, mild aortic stenosis and mild left atrial dilation with normal RV function. 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 He has been tapering down on the steroids over the last couple of months and is down to 10mg  a day.   He was started on imatinib in June but was unable to tolerate the higher dose due to mucositis. He was also admitted to the hospital at the end of June for candida UTI which was treated with diflucan. 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 He was restarted on imatinib in August and is currently taking 100mg  daily.   Patient reports his breathing has improved since starting on the prednisone and stopping the dasatinib. His breathing has not worsened since starting the imatinib. He still complains of  shortness of breath. He reports some wheezing and denies cough. He was initially started on oxygen therapy but has sent the equipement back once his shortness of breath improved. He reports his oxygen saturations are around 97% at rest and can drop to 93-94% when he is working in the yard.   He has worked in [employer] in the past and [employer] where he was exposed to various dusts. He worked at the [employer] where he was also exposed to dusts and chemicals.     Past Medical History:  Diagnosis Date  . ASCVD (arteriosclerotic cardiovascular disease)   . Chest pain   . Collagen vascular disease ([place])   . Complication of anesthesia    "I had local anesthesia , and I went out for 3 hrs "  . 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 Coronary artery disease   . Diabetes mellitus without complication ([place])   . Dyspnea   . GERD (gastroesophageal reflux disease)   . Hypertension   . Palpitations      Family History  Problem Relation Age of Onset  . Heart attack Mother   . Heart attack Father   . Diabetes Father      Social History   Socioeconomic History  . Marital status: Married    Spouse name: Not on file  . Number of children: Not on file  . Years of education: Not on file  . Highest education level: Not on file  Occupational History  . Not on file  Tobacco  Use  . Smoking status: Never Smoker  . Smokeless tobacco: Never Used  Vaping Use  . Vaping Use: Never used  Substance and Sexual Activity  . Alcohol use: Never  . Drug use: Never  . Sexual activity: Not on file  Other Topics Concern  . Not on file  Social History Narrative  . Not on file   Social Determinants of Health   Financial Resource Strain:   . Difficulty of Paying Living Expenses: Not on file  Food Insecurity:   . Worried About Charity fundraiser in the Last Year: Not on file  . Ran Out of Food in the Last Year: Not on file  Transportation Needs:   . Lack of Transportation (Medical): Not on  file  . Lack of Transportation (Non-Medical): Not on file  Physical Activity:   . Days of Exercise per Week: Not on file  . Minutes of Exercise per Session: Not on file  Stress:   . Feeling of Stress : Not on file  Social Connections:   . Frequency of Communication with Friends and Family: Not on file  . Frequency of Social Gatherings with Friends and Family: Not on file  . Attends Religious Services: Not on file  . Active Member of Clubs or Organizations: Not on file  . Attends Archivist Meetings: Not on file  . Marital Status: Not on file  Intimate Partner Violence:   . Fear of Current or Ex-Partner: Not on file  . Emotionally Abused: Not on file  . Physically Abused: Not on file  . Sexually Abused: Not on file     Allergies  Allergen Reactions  . Eggs Or Egg-Derived Products   . Flagyl [Metronidazole]   . Penicillins Rash    Has patient had a PCN reaction causing immediate rash, facial/tongue/throat swelling, SOB or lightheadedness with hypotension: Yes Has patient had a PCN reaction causing severe rash involving mucus membranes or skin necrosis: No Has patient had a PCN reaction that required hospitalization: No Has patient had a PCN reaction occurring within the last 10 years: No If all of the above answers are "NO", then may proceed with Cephalosporin use.'     Outpatient Medications Prior to Visit  Medication Sig Dispense Refill  . aspirin EC 81 MG tablet Take 81 mg by mouth daily.    Marland Kitchen atorvastatin (LIPITOR) 10 MG tablet Take 10 mg by mouth at bedtime.     . Calcium-Magnesium-Vitamin D (CALCIUM 500 PO) Take 1 tablet by mouth 2 (two) times daily.    . Cholecalciferol (VITAMIN D3) 5000 units TABS Take 5,000 Units by mouth daily.     . clopidogrel (PLAVIX) 75 MG tablet TAKE 1 TABLET BY MOUTH EVERY DAY 90 tablet 1  . cyclobenzaprine (FLEXERIL) 10 MG tablet Take 10 mg by mouth at bedtime as needed for muscle spasms.     . fluticasone (FLONASE) 50 MCG/ACT nasal  spray Place 2 sprays into both nostrils daily.    . folic acid (FOLVITE) 1 MG tablet Take 1 mg by mouth daily.    Marland Kitchen gabapentin (NEURONTIN) 600 MG tablet Take 600 mg by mouth 3 (three) times daily.    Marland Kitchen losartan-hydrochlorothiazide (HYZAAR) 100-12.5 MG tablet Take 0.5 tablets by mouth daily.    . nitroGLYCERIN (NITROSTAT) 0.4 MG SL tablet Place 1 tablet (0.4 mg total) under the tongue every 5 (five) minutes as needed for chest pain. 25 tablet 3  . Omega-3 Fatty Acids (FISH OIL) 1000 MG CAPS Take  2,000 mg by mouth 2 (two) times daily.     . pantoprazole (PROTONIX) 40 MG tablet Take 40 mg by mouth daily.    . polyethylene glycol (MIRALAX / GLYCOLAX) packet Take 17 g by mouth daily.    . tamsulosin (FLOMAX) 0.4 MG CAPS capsule Take 0.4 mg by mouth daily.    . valACYclovir (VALTREX) 500 MG tablet Take 2,000 mg by mouth 2 (two) times daily as needed (fever blisters).     . methotrexate 2.5 MG tablet Take 20 mg by mouth every Saturday.  (Patient not taking: Reported on 07/03/2020)     No facility-administered medications prior to visit.    Review of Systems  Constitutional: Negative for chills, diaphoresis, fever, malaise/fatigue and weight loss.  HENT: Negative for congestion, nosebleeds, sinus pain and sore throat.   Eyes: Negative for blurred vision.  Respiratory: Positive for shortness of breath and wheezing. Negative for cough, hemoptysis and sputum production.   Cardiovascular: Negative for chest pain, palpitations, orthopnea, claudication, leg swelling and PND.  Gastrointestinal: Negative for abdominal pain, blood in stool, diarrhea, heartburn, melena, nausea and vomiting.  Genitourinary: Negative for dysuria, frequency and hematuria.  Musculoskeletal: Positive for falls (lost balance last month, hurt shoulder) and joint pain. Negative for myalgias.  Skin: Negative for itching and rash.  Neurological: Negative for dizziness, weakness and headaches.  Endo/Heme/Allergies: Does not bruise/bleed  easily.  Psychiatric/Behavioral: Negative.    Objective:   Vitals:   07/03/20 1407  BP: 132/64  Pulse: (!) 111  Temp: 98.3 F (36.8 C)  TempSrc: Oral  SpO2: 98%  Weight: 181 lb 3.2 oz (82.2 kg)     Physical Exam Constitutional:      General: He is not in acute distress.    Appearance: Normal appearance. He is normal weight. He is not ill-appearing.  HENT:     Head: Normocephalic and atraumatic.     Nose: Nose normal.     Mouth/Throat:     Mouth: Mucous membranes are dry.     Pharynx: Oropharynx is clear.  Eyes:     Extraocular Movements: Extraocular movements intact.     Conjunctiva/sclera: Conjunctivae normal.     Pupils: Pupils are equal, round, and reactive to light.  Cardiovascular:     Rate and Rhythm: Normal rate and regular rhythm.     Pulses: Normal pulses.     Heart sounds: No murmur heard.   Pulmonary:     Effort: Pulmonary effort is normal. No respiratory distress.     Breath sounds: Examination of the left-upper field reveals rales. Examination of the right-middle field reveals rales. Rales (dry) present.  Abdominal:     General: Abdomen is flat. Bowel sounds are normal. There is no distension.     Palpations: Abdomen is soft.     Tenderness: There is no abdominal tenderness.  Musculoskeletal:     Cervical back: Neck supple.     Right lower leg: No edema.     Left lower leg: No edema.  Lymphadenopathy:     Cervical: No cervical adenopathy.  Skin:    General: Skin is warm and dry.     Capillary Refill: Capillary refill takes less than 2 seconds.  Neurological:     General: No focal deficit present.     Mental Status: He is alert and oriented to person, place, and time. Mental status is at baseline.     Gait: Gait normal.  Psychiatric:        Mood and Affect: Mood  normal.        Behavior: Behavior normal.        Thought Content: Thought content normal.        Judgment: Judgment normal.     CBC    Component Value Date/Time   WBC 7.4  06/29/2018 0247   RBC 3.79 (L) 06/29/2018 0247   HGB 11.6 (L) 06/29/2018 0247   HCT 34.9 (L) 06/29/2018 0247   PLT 237 06/29/2018 0247   MCV 92.1 06/29/2018 0247   MCH 30.6 06/29/2018 0247   MCHC 33.2 06/29/2018 0247   RDW 13.4 06/29/2018 0247     Chest imaging: 03/02/20 CT Chest: 1. Bilateral peripheral predominant patchy areas of ground-glass  attenuation, interstitial thickening and reticulation with mild  traction bronchiectasis. Findings compatible with interstitial lung  disease, favor hypersensitivity pneumonitis. Cannot rule out drug  toxicity. No frank honeycombing identified at this point. Recommend  follow-up imaging in 6 months with high-resolution CT of the chest  to assess for any temporal changes in the appearance of the lungs.  2. 3 mm right upper lobe lung nodule. No follow-up needed if patient  is low-risk. Non-contrast chest CT can be considered in 12 months if  patient is high-risk.  3. 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  4. Aortic atherosclerosis, in addition to 3 vessel coronary artery  disease. Please note that although the presence of coronary artery  calcium documents the presence of coronary artery disease, the  severity of this disease and any potential stenosis cannot be  assessed on this non-gated CT examination. Assessment for potential  risk factor modification, dietary therapy or pharmacologic therapy  may be warranted, if clinically indicated.   CXR 03/27/20 Bilateral interstitial prominence again noted. Interstitial changes  are less prominent than noted on prior CT of 03/02/2020  CXR 04/15/20 Lungs are hypoinflated with  stable elevation of the left hemidiaphragm. There is interval  worsening of patchy bilateral hazy mixed interstitial airspace  density. Note that a patchy bilateral ground-glass process was noted  on the  recent previous CT. Linear atelectasis over the left  perihilar region. No effusion. Cardiomediastinal silhouette is  normal. Remainder of the exam is unchanged.   PFT: No PFT on file  Labs: 06/21/20 CBC: WBC 47.9, HGB 12, HCT 38.2, Plt 895 06/21/20 BMP: Na 135, K 3.9, Cl 99, Co2 26.8, Cr 1.56, BUN 18   Echo: 03/02/20 1. Technically difficult study due to chest wall/lung interference.  2. The left ventricle is normal in size with normal wall thickness.  3. The left ventricular systolic function is normal, LVEF is visually  estimated at 60-65%.  4. Visually there is mild aortic valve stenosis.  5. The left atrium is mildly dilated in size.  6. The right ventricle is normal in size, with normal systolic function.      Assessment & Plan:   Dyspnea on exertion  ILD (interstitial lung disease) ([place]) - Plan: Pulmonary Function Test  Discussion: Gaberial Cada is a 79 year old male, never smoker with history of rheumatoid arthritis and CML who is referred to pulmonary clinic for shortness of breath.  The onset of shortness of breath was associated shortly after initiation of Dasatinib and he was treated appropriately with withdrawal of the offending agent and placed on a prolonged prednisone taper. Other differentials include rheumatoid arthritis related interstitial lung disease and less likely methotrexate pulmonary toxicity.  He has started to  notice increasing joint pains since tapering down to 10mg  of prednisone daily. He has not noticed worsening in his dyspnea but remains short of breath with exertion. He may remain at 10mg  of prednisone daily until follow up with [place] in 2 months for the interstitial lung disease and if he requires increased amount of steroids for the joint pains that is ok. If he is placed on 20mg  of prednisone daily or higher, he should be started on bactrim prophylaxis for pneumocystis coverage. He may also be restarted on the methotrexate for his joint pains,  as this has been reported to possibly be protective from an ILD standpoint but I will leave this decision to his rheumatologist and hematology team based on the risk profile with his CML treatment.  We will obtain a full pulmonary function test at the follow up visit as well as a 6 minute walk test. Should he develop progressive shortness of breath, we will have him back in clinic sooner. He can use albuterol as needed for dyspnea.   Follow up in 2 months.  Freda Jackson, MD [facility] Office: [phone]     Current Outpatient Medications:  .  aspirin EC 81 MG tablet, Take 81 mg by mouth daily., Disp: , Rfl:  .  atorvastatin (LIPITOR) 10 MG tablet, Take 10 mg by mouth at bedtime. , Disp: , Rfl:  .  Calcium-Magnesium-Vitamin D (CALCIUM 500 PO), Take 1 tablet by mouth 2 (two) times daily., Disp: , Rfl:  .  Cholecalciferol (VITAMIN D3) 5000 units TABS, Take 5,000 Units by mouth daily. , Disp: , Rfl:  .  clopidogrel (PLAVIX) 75 MG tablet, TAKE 1 TABLET BY MOUTH EVERY DAY, Disp: 90 tablet, Rfl: 1 .  cyclobenzaprine (FLEXERIL) 10 MG tablet, Take 10 mg by mouth at bedtime as needed for muscle spasms. , Disp: , Rfl:  .  fluticasone (FLONASE) 50 MCG/ACT nasal spray, Place 2 sprays into both nostrils daily., Disp: , Rfl:  .  folic acid (FOLVITE) 1 MG tablet, Take 1 mg by mouth daily., Disp: , Rfl:  .  gabapentin (NEURONTIN) 600 MG tablet, Take 600 mg by mouth 3 (three) times daily., Disp: , Rfl:  .  losartan-hydrochlorothiazide (HYZAAR) 100-12.5 MG tablet, Take 0.5 tablets by mouth daily., Disp: , Rfl:  .  nitroGLYCERIN (NITROSTAT) 0.4 MG SL tablet, Place 1 tablet (0.4 mg total) under the tongue every 5 (five) minutes as needed for chest pain., Disp: 25 tablet, Rfl: 3 .  Omega-3 Fatty Acids (FISH OIL) 1000 MG CAPS, Take 2,000 mg by mouth 2 (two) times daily. , Disp: , Rfl:  .  pantoprazole (PROTONIX) 40 MG tablet, Take 40 mg by mouth daily., Disp: , Rfl:  .  polyethylene  glycol (MIRALAX / GLYCOLAX) packet, Take 17 g by mouth daily., Disp: , Rfl:  .  tamsulosin (FLOMAX) 0.4 MG CAPS capsule, Take 0.4 mg by mouth daily., Disp: , Rfl:  .  valACYclovir (VALTREX) 500 MG tablet, Take 2,000 mg by mouth 2 (two) times daily as needed (fever blisters). , Disp: , Rfl:  .  methotrexate 2.5 MG tablet, Take 20 mg by mouth every Saturday.  (Patient not taking: Reported on 07/03/2020), Disp: , Rfl:

## 2020-07-03 NOTE — Patient Instructions (Signed)
-   We will have you follow up in 2 months with pulmonary function testing - Continue on the prednisone per your Hematology team and Rheumatology team. It is ok to stay on 10mg  or increase to 15mg  if needed for the joint pains

## 2020-07-10 ENCOUNTER — Telehealth: Payer: Self-pay | Admitting: Pulmonary Disease

## 2020-07-10 NOTE — Telephone Encounter (Signed)
Spoke with Dr. Federico Flake and he was ok with increasing steroids for the rheumatoid arthritis and pulmonary disease if needed from an oncology standpoint.  Patient has had many complications with TKI therapy and infections that this would be the safest route for his treatment plan at this time.

## 2020-07-19 ENCOUNTER — Ambulatory Visit: Payer: Medicare Other | Admitting: Family Medicine

## 2020-08-01 ENCOUNTER — Telehealth: Payer: Self-pay | Admitting: Pulmonary Disease

## 2020-08-01 NOTE — Telephone Encounter (Signed)
OV note has been faxed. Nothing further was needed. 

## 2020-08-05 NOTE — Progress Notes (Deleted)
Cardiology Office Note  Date: 08/05/2020   ID: Lonnie Duffy 09-23-1941, MRN [id number]  PCP:  Caryl Bis, MD  Cardiologist:  No primary care provider on file. Electrophysiologist:  None   Chief Complaint: CAD  History of Present Illness: TENNIS Duffy is a 79 y.o. male with a history of CAD, DOE, collagen vascular disease, CML, DM 2, GERD, HTN, palpitations . Last encounter with Dr. Bronson Ing 01/19/2020.  DES x2 to LAD September 2019.  On DAPT therapy indefinitely.  Continuing atorvastatin.  Nitroglycerin was refilled.  LDL was 64 03/04/2019.  Blood pressure was normal no changes to therapy.  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  CML diagnosed within the prior months.  Labs 12/15/2018 showed platelets 600,000, hemoglobin 13.8, white cells 45.  Plans were to start him on dasatinib.  Had a recent echocardiogram 03/02/2020 by Dr. Candie Echevaria.  Past Medical History:  Diagnosis Date  . ASCVD (arteriosclerotic cardiovascular disease)   . Chest pain   . Collagen vascular disease ([place])   . Complication of anesthesia    "I had local anesthesia , and I went out for 3 hrs "  . 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 Coronary artery disease   . Diabetes mellitus without complication ([place])   . Dyspnea   . GERD (gastroesophageal reflux disease)   . Hypertension   . Palpitations     Past Surgical History:  Procedure Laterality Date  . APPENDECTOMY  1987  . CARDIAC CATHETERIZATION    . CATARACT EXTRACTION  11/2009 & 12/2015  . CORONARY STENT INTERVENTION  06/28/2018  . CORONARY STENT INTERVENTION N/A 06/28/2018   Procedure: CORONARY STENT INTERVENTION;  Surgeon: Nelva Bush, MD;  Location: [facility];  Service: Cardiovascular;  Laterality: N/A;  . GALLBLADDER SURGERY  1989  . INTRAVASCULAR ULTRASOUND/IVUS N/A 06/28/2018   Procedure: Intravascular Ultrasound/IVUS;  Surgeon: Nelva Bush, MD;  Location: [facility];  Service:  Cardiovascular;  Laterality: N/A;  . NASAL SINUS SURGERY  11/21/1987  . RIGHT/LEFT HEART CATH AND CORONARY ANGIOGRAPHY N/A 06/28/2018   Procedure: RIGHT/LEFT HEART CATH AND CORONARY ANGIOGRAPHY;  Surgeon: Nelva Bush, MD;  Location: [facility];  Service: Cardiovascular;  Laterality: N/A;  . SHOULDER SURGERY  2007  . stent in lad  06/07/2004  . TONSILLECTOMY  1971  . trigerfinger  1990    Current Outpatient Medications  Medication Sig Dispense Refill  . aspirin EC 81 MG tablet Take 81 mg by mouth daily.    Marland Kitchen atorvastatin (LIPITOR) 10 MG tablet Take 10 mg by mouth at bedtime.     . Calcium-Magnesium-Vitamin D (CALCIUM 500 PO) Take 1 tablet by mouth 2 (two) times daily.    . Cholecalciferol (VITAMIN D3) 5000 units TABS Take 5,000 Units by mouth daily.     . clopidogrel (PLAVIX) 75 MG tablet TAKE 1 TABLET BY MOUTH EVERY DAY 90 tablet 1  . cyclobenzaprine (FLEXERIL) 10 MG tablet Take 10 mg by mouth at bedtime as needed for muscle spasms.     . fluticasone (FLONASE) 50 MCG/ACT nasal spray Place 2 sprays into both nostrils daily.    . folic acid (FOLVITE) 1 MG tablet Take 1 mg by mouth daily.    Marland Kitchen gabapentin (NEURONTIN) 600 MG tablet Take 600 mg by mouth 3 (three) times daily.    Marland Kitchen losartan-hydrochlorothiazide (HYZAAR) 100-12.5 MG tablet Take 0.5 tablets by mouth daily.    . methotrexate 2.5 MG tablet Take 20  mg by mouth every Saturday.  (Patient not taking: Reported on 07/03/2020)    . Omega-3 Fatty Acids (FISH OIL) 1000 MG CAPS Take 2,000 mg by mouth 2 (two) times daily.     . pantoprazole (PROTONIX) 40 MG tablet Take 40 mg by mouth daily.    . polyethylene glycol (MIRALAX / GLYCOLAX) packet Take 17 g by mouth daily.    . tamsulosin (FLOMAX) 0.4 MG CAPS capsule Take 0.4 mg by mouth daily.    . valACYclovir (VALTREX) 500 MG tablet Take 2,000 mg by mouth 2 (two) times daily as needed (fever blisters).      No current facility-administered medications for this visit.   Allergies:   Eggs or egg-derived products, Flagyl [metronidazole], and Penicillins   Social History: The patient  reports that he has never smoked. He has never used smokeless tobacco. He reports that he does not drink alcohol and does not use drugs.   Family History: The patient's family history includes Diabetes in his father; Heart attack in his father and mother.   ROS:  Please see the history of present illness. Otherwise, complete review of systems is positive for none.  All other systems are reviewed and negative.   Physical Exam: VS:  There were no vitals taken for this visit., BMI There is no height or weight on file to calculate BMI.  Wt Readings from Last 3 Encounters:  07/03/20 181 lb 3.2 oz (82.2 kg)  01/19/20 175 lb (79.4 kg)  05/10/19 172 lb 6.4 oz (78.2 kg)    General: Patient appears comfortable at rest. HEENT: Conjunctiva and lids normal, oropharynx clear with moist mucosa. Neck: Supple, no elevated JVP or carotid bruits, no thyromegaly. Lungs: Clear to auscultation, nonlabored breathing at rest. Cardiac: Regular rate and rhythm, no S3 or significant systolic murmur, no pericardial rub. Abdomen: Soft, nontender, no hepatomegaly, bowel sounds present, no guarding or rebound. Extremities: No pitting edema, distal pulses 2+. Skin: Warm and dry. Musculoskeletal: No kyphosis. Neuropsychiatric: Alert and oriented x3, affect grossly appropriate.  ECG:  {EKG/Telemetry Strips [phone]}  Recent Labwork: No results found for requested labs within last 8760 hours.     Component Value Date/Time   CHOL 115 06/29/2018 0247   TRIG 121 06/29/2018 0247   HDL 35 (L) 06/29/2018 0247   CHOLHDL 3.3 06/29/2018 0247   VLDL 24 06/29/2018 0247   LDLCALC 56 06/29/2018 0247    Other Studies Reviewed Today:  Echocardiogram 03/02/2020 Indication DOE : Dr Candie Echevaria Summary  1. Technically difficult study due to chest wall/lung interference.  2. The left ventricle is normal in size  with normal wall thickness.  3. The left ventricular systolic function is normal, LVEF is visually  estimated at 60-65%.  4. Visually there is mild aortic valve stenosis.  5. The left atrium is mildly dilated in size.  6. The right ventricle is normal in size, with normal systolic function.      Left Ventricle  The left ventricle is normal in size with normal wall thickness.  The left ventricular systolic function is normal, LVEF is visually estimated  at 60-65%.  There is age related grade I diastolic dysfunction.    Right Ventricle  The right ventricle is normal in size, with normal systolic function.      Left Atrium  The left atrium is mildly dilated in size.    Right Atrium  The right atrium is normal in size.      Aortic Valve  The aortic valve is  probably trileaflet with mildly thickened leaflets with  mildly reduced excursion.  There is no significant aortic regurgitation.  Visually there is mild aortic valve stenosis.  Peak AV transvalvular velocity: 2.1 m/s.  Mean gradient: 9 mmHg.  Doppler velocity index: 0.73.  Estimated aortic valve area (VTI): 2.5 cm2.  Estimated aortic valve area (velocity): 2.3 cm2.  LVOT diameter: 2.0 cm.  LV stroke volume index: 16.5 ml/m2.    Pulmonic Valve  The pulmonic valve is normal.  There is no significant pulmonic regurgitation.  There is no evidence of a significant transvalvular gradient.    Mitral Valve  The mitral valve leaflets are normal with normal leaflet mobility.  There is no significant mitral valve regurgitation.    Tricuspid Valve  The tricuspid valve leaflets are normal, with normal leaflet mobility.  There is no significant tricuspid regurgitation.  Pulmonary systolic pressure cannot be estimated due to insufficient TR jet.      Other Findings  Rhythm: Sinus Rhythm.    Pericardium/Pleural  There is no pericardial  effusion.    Inferior Vena Cava  The IVC is not well visualized precluding the ability to accurate assess  right atrial pressure.    Aorta  The aorta is normal in size in the visualized segments.      06/28/2018 RIGHT/LEFT HEART CATH AND CORONARY ANGIOGRAPHY  Conclusion  Conclusions: 1. Severe single-vessel coronary artery involving proximal and distal edges/segments of previously placed proximal LAD stent. 2. Mild, non-obstructive CAD involving mid RCA. 3. Normal left heart, right heart, and pulmonary artery pressures. 4. Normal left ventricular systolic function and Fick cardiac output. 5. Successful IVUS-guided PCI to proximal and mid LAD with placement of Synergy 2.5 x 16 mm and Synergy 2.25 x 20 mm drug-eluting stents.  Both stents overlap the previously placed Taxus stent in the proximal LAD.  Final angiogram shows 0% residual stenosis with TIMI-3 flow.  Recommendations: 1. Aggressive secondary prevention. 2. Overnight extended recovery due to significant right arm/shoulder pain during and after catheterization secondary to chronic arthritis.  Recommend dual antiplatelet therapy with aspirin and ticagrelor for at least 12 months followed by indefinite dual antiplatelet therapy (aspirin and ticagrelor or clopidogrel) because of prior Taxus stent placed in the proximal LAD and elevated risk for late stent thrombosis.  Diagnostic Dominance: Right  Intervention     Assessment and Plan:  1. CAD in native artery   2. Mixed hyperlipidemia   3. Essential hypertension   4. Cardiac murmur    1. CAD in native artery ***  2. Mixed hyperlipidemia ***  3. Essential hypertension ***  4. Cardiac murmur ***  Medication Adjustments/Labs and Tests Ordered: Current medicines are reviewed at length with the patient today.  Concerns regarding medicines are outlined above.   Disposition: Follow-up with ***  Signed, Levell July, NP 08/05/2020 6:25 PM    [facility]  [facility] [place], [place], [place] [address] Phone: [phone]; Fax: [phone]

## 2020-08-06 ENCOUNTER — Ambulatory Visit: Payer: Medicare Other | Admitting: Family Medicine

## 2020-08-06 DIAGNOSIS — R011 Cardiac murmur, unspecified: Secondary | ICD-10-CM

## 2020-08-06 DIAGNOSIS — I10 Essential (primary) hypertension: Secondary | ICD-10-CM

## 2020-08-06 DIAGNOSIS — I2510 Atherosclerotic heart disease of native coronary artery without angina pectoris: Secondary | ICD-10-CM

## 2020-08-06 DIAGNOSIS — E782 Mixed hyperlipidemia: Secondary | ICD-10-CM

## 2020-08-20 NOTE — Progress Notes (Signed)
Cardiology Office Note  Date: 08/21/2020   ID: [name], DOB [birth year], MRN [id number]  PCP:  Caryl Bis, MD  Cardiologist:  No primary care provider on file. Electrophysiologist:  None   Chief Complaint: CAD  History of Present Illness: Lonnie Duffy is a 79 y.o. male with a history of CAD, DOE, collagen vascular disease, CML, DM 2, GERD, HTN, palpitations . Last encounter with Dr. Bronson Ing 01/19/2020.  DES x2 to LAD September 2019.  On DAPT therapy indefinitely.  Continuing atorvastatin.  Nitroglycerin was refilled.  LDL was 64 03/04/2019.  Blood pressure was normal no changes to therapy.  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  CML diagnosed within the prior months.  Labs 12/15/2018 showed platelets 600,000, hemoglobin 13.8, white cells 45.  Plans were to start him on dasatinib.  Had a recent echocardiogram 03/02/2020 by Dr. Candie Echevaria.  He presents today with no particular complaints other than some chronic dyspnea.  States he had a recent echocardiogram and CT scan of his lungs due to continued DOE.  Echocardiogram showed normal EF with some mild aortic stenosis.  CT scan 03/02/2020 showed bilateral peripheral predominant patchy areas of groundglass attenuation.  Interstitial thickening and reticulation with mild traction bronchitis.  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  There was a 2.3 mm right upper lobe lung nodule.  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  Evidence of aortic atherosclerosis in addition three-vessel coronary artery disease.  States he has a pending appointment with pulmonology in [place].  He continues with chronic stable dyspnea.  He states his oncologist increased his aspirin to 162 mg daily due to elevated platelets of 983.  Past Medical History:  Diagnosis Date  .  ASCVD (arteriosclerotic cardiovascular disease)   . Chest pain   . Collagen vascular disease ([place])   . Complication of anesthesia    "I had local anesthesia , and I went out for 3 hrs "  . 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 Coronary artery disease   . Diabetes mellitus without complication ([place])   . Dyspnea   . GERD (gastroesophageal reflux disease)   . Hypertension   . Palpitations     Past Surgical History:  Procedure Laterality Date  . APPENDECTOMY  1987  . CARDIAC CATHETERIZATION    . CATARACT EXTRACTION  11/2009 & 12/2015  . CORONARY STENT INTERVENTION  06/28/2018  . CORONARY STENT INTERVENTION N/A 06/28/2018   Procedure: CORONARY STENT INTERVENTION;  Surgeon: Nelva Bush, MD;  Location: [facility];  Service: Cardiovascular;  Laterality: N/A;  . GALLBLADDER SURGERY  1989  . INTRAVASCULAR ULTRASOUND/IVUS N/A 06/28/2018   Procedure: Intravascular Ultrasound/IVUS;  Surgeon: Nelva Bush, MD;  Location: [facility];  Service: Cardiovascular;  Laterality: N/A;  . NASAL SINUS SURGERY  11/21/1987  . RIGHT/LEFT HEART CATH AND CORONARY ANGIOGRAPHY N/A 06/28/2018   Procedure: RIGHT/LEFT HEART CATH AND CORONARY ANGIOGRAPHY;  Surgeon: Nelva Bush, MD;  Location: [facility];  Service: Cardiovascular;  Laterality: N/A;  . SHOULDER SURGERY  2007  . stent in lad  06/07/2004  . TONSILLECTOMY  1971  . trigerfinger  1990    Current Outpatient Medications  Medication Sig Dispense Refill  . aspirin EC 81 MG tablet Take 162 mg by mouth daily.     Marland Kitchen atorvastatin (LIPITOR) 10 MG tablet Take 10 mg by mouth at bedtime.     Marland Kitchen  Calcium-Magnesium-Vitamin D (CALCIUM 500 PO) Take 1 tablet by mouth 2 (two) times daily.    . Cholecalciferol (VITAMIN D3) 5000 units TABS Take 5,000 Units by mouth daily.     . clopidogrel (PLAVIX) 75 MG tablet TAKE 1 TABLET BY MOUTH EVERY DAY 90 tablet 1  . cyclobenzaprine (FLEXERIL) 10 MG tablet Take 10 mg by mouth at bedtime as needed for  muscle spasms.     . fluconazole (DIFLUCAN) 150 MG tablet Take 150 mg by mouth daily.    . fluticasone (FLONASE) 50 MCG/ACT nasal spray Place 2 sprays into both nostrils daily.    . folic acid (FOLVITE) 1 MG tablet Take 1 mg by mouth daily.    Marland Kitchen gabapentin (NEURONTIN) 600 MG tablet Take 600 mg by mouth 3 (three) times daily.    Marland Kitchen losartan-hydrochlorothiazide (HYZAAR) 100-12.5 MG tablet Take 0.5 tablets by mouth daily.    . Omega-3 Fatty Acids (FISH OIL) 1000 MG CAPS Take 2,000 mg by mouth 2 (two) times daily.     . pantoprazole (PROTONIX) 40 MG tablet Take 40 mg by mouth daily.    . polyethylene glycol (MIRALAX / GLYCOLAX) packet Take 17 g by mouth daily.    . predniSONE (DELTASONE) 10 MG tablet Take 10 mg by mouth daily.    . tamsulosin (FLOMAX) 0.4 MG CAPS capsule Take 0.4 mg by mouth daily.    . valACYclovir (VALTREX) 1000 MG tablet Take 1,000 mg by mouth daily.     No current facility-administered medications for this visit.   Allergies:  Eggs or egg-derived products, Flagyl [metronidazole], and Penicillins   Social History: The patient  reports that he has never smoked. He has never used smokeless tobacco. He reports that he does not drink alcohol and does not use drugs.   Family History: The patient's family history includes Diabetes in his father; Heart attack in his father and mother.   ROS:  Please see the history of present illness. Otherwise, complete review of systems is positive for none.  All other systems are reviewed and negative.   Physical Exam: VS:  BP 138/68   Pulse 96   Ht 5\' 7"  (1.702 m)   Wt 185 lb 12.8 oz (84.3 kg)   SpO2 96%   BMI 29.10 kg/m , BMI Body mass index is 29.1 kg/m.  Wt Readings from Last 3 Encounters:  08/21/20 185 lb 12.8 oz (84.3 kg)  07/03/20 181 lb 3.2 oz (82.2 kg)  01/19/20 175 lb (79.4 kg)    General: Patient appears comfortable at rest. Neck: Supple, no elevated JVP or carotid bruits, no thyromegaly. Lungs: Clear to auscultation,  nonlabored breathing at rest. Cardiac: Regular rate and rhythm, no S3 or significant systolic murmur, no pericardial rub. Extremities: No pitting edema, distal pulses 2+. Skin: Warm and dry. Musculoskeletal: No kyphosis. Neuropsychiatric: Alert and oriented x3, affect grossly appropriate.  ECG:  EKG January 19, 2020 normal sinus rhythm nonspecific T wave abnormality rate of 98.  Recent Labwork: No results found for requested labs within last 8760 hours.     Component Value Date/Time   CHOL 115 06/29/2018 0247   TRIG 121 06/29/2018 0247   HDL 35 (L) 06/29/2018 0247   CHOLHDL 3.3 06/29/2018 0247   VLDL 24 06/29/2018 0247   LDLCALC 56 06/29/2018 0247    Other Studies Reviewed Today:  Echocardiogram 03/02/2020 Indication DOE : Dr Candie Echevaria Summary  1. Technically difficult study due to chest wall/lung interference.  2. The left ventricle is normal in  size with normal wall thickness.  3. The left ventricular systolic function is normal, LVEF is visually  estimated at 60-65%.  4. Visually there is mild aortic valve stenosis.  5. The left atrium is mildly dilated in size.  6. The right ventricle is normal in size, with normal systolic function.      Left Ventricle  The left ventricle is normal in size with normal wall thickness.  The left ventricular systolic function is normal, LVEF is visually estimated  at 60-65%.  There is age related grade I diastolic dysfunction.    Right Ventricle  The right ventricle is normal in size, with normal systolic function.      Left Atrium  The left atrium is mildly dilated in size.    Right Atrium  The right atrium is normal in size.      Aortic Valve  The aortic valve is probably trileaflet with mildly thickened leaflets with  mildly reduced excursion.  There is no significant aortic regurgitation.  Visually there is mild aortic valve stenosis.  Peak AV transvalvular velocity: 2.1 m/s.   Mean gradient: 9 mmHg.  Doppler velocity index: 0.73.  Estimated aortic valve area (VTI): 2.5 cm2.  Estimated aortic valve area (velocity): 2.3 cm2.  LVOT diameter: 2.0 cm.  LV stroke volume index: 16.5 ml/m2.    Pulmonic Valve  The pulmonic valve is normal.  There is no significant pulmonic regurgitation.  There is no evidence of a significant transvalvular gradient.    Mitral Valve  The mitral valve leaflets are normal with normal leaflet mobility.  There is no significant mitral valve regurgitation.    Tricuspid Valve  The tricuspid valve leaflets are normal, with normal leaflet mobility.  There is no significant tricuspid regurgitation.  Pulmonary systolic pressure cannot be estimated due to insufficient TR jet.      Other Findings  Rhythm: Sinus Rhythm.    Pericardium/Pleural  There is no pericardial effusion.    Inferior Vena Cava  The IVC is not well visualized precluding the ability to accurate assess  right atrial pressure.    Aorta  The aorta is normal in size in the visualized segments.      06/28/2018 RIGHT/LEFT HEART CATH AND CORONARY ANGIOGRAPHY  Conclusion  Conclusions: 1. Severe single-vessel coronary artery involving proximal and distal edges/segments of previously placed proximal LAD stent. 2. Mild, non-obstructive CAD involving mid RCA. 3. Normal left heart, right heart, and pulmonary artery pressures. 4. Normal left ventricular systolic function and Fick cardiac output. 5. Successful IVUS-guided PCI to proximal and mid LAD with placement of Synergy 2.5 x 16 mm and Synergy 2.25 x 20 mm drug-eluting stents.  Both stents overlap the previously placed Taxus stent in the proximal LAD.  Final angiogram shows 0% residual stenosis with TIMI-3 flow.  Recommendations: 1. Aggressive secondary prevention. 2. Overnight extended recovery due to significant right arm/shoulder pain during and after  catheterization secondary to chronic arthritis.  Recommend dual antiplatelet therapy with aspirin and ticagrelor for at least 12 months followed by indefinite dual antiplatelet therapy (aspirin and ticagrelor or clopidogrel) because of prior Taxus stent placed in the proximal LAD and elevated risk for late stent thrombosis.  Diagnostic Dominance: Right  Intervention     Assessment and Plan:   1. CAD in native artery Denies any anginal or exertional symptoms currently.  He does have chronic stable dyspnea secondary to CLL affecting his lungs per his statement.  Continue aspirin 162 mg daily.  Plavix 75 mg p.o.  daily.  2. Mixed hyperlipidemia Continue atorvastatin 10 mg daily.  Labs per PCP.  3. Essential hypertension Blood pressure 138/68.  Continue losartan/HCTZ 100/12.5 mg 1/2 pill daily.  4. Cardiac murmur Recent echocardiogram in May 2021 demonstrated mild aortic valve stenosis.  Medication Adjustments/Labs and Tests Ordered: Current medicines are reviewed at length with the patient today.  Concerns regarding medicines are outlined above.   Disposition: Follow-up with Dr. Harl Bowie or APP 6 months  Signed, Levell July, NP 08/21/2020 9:52 AM    [facility] [place], [place], [place] [address] Phone: [phone]; Fax: [phone]

## 2020-08-21 ENCOUNTER — Ambulatory Visit (INDEPENDENT_AMBULATORY_CARE_PROVIDER_SITE_OTHER): Payer: Medicare Other | Admitting: Pulmonary Disease

## 2020-08-21 ENCOUNTER — Encounter: Payer: Self-pay | Admitting: Pulmonary Disease

## 2020-08-21 ENCOUNTER — Other Ambulatory Visit: Payer: Self-pay

## 2020-08-21 ENCOUNTER — Encounter: Payer: Self-pay | Admitting: Family Medicine

## 2020-08-21 ENCOUNTER — Ambulatory Visit: Payer: Medicare Other | Admitting: Family Medicine

## 2020-08-21 ENCOUNTER — Ambulatory Visit: Payer: Medicare Other | Admitting: Pulmonary Disease

## 2020-08-21 VITALS — BP 138/68 | HR 96 | Ht 67.0 in | Wt 185.8 lb

## 2020-08-21 VITALS — BP 132/66 | HR 96 | Temp 98.2°F | Ht 66.5 in | Wt 185.0 lb

## 2020-08-21 DIAGNOSIS — J849 Interstitial pulmonary disease, unspecified: Secondary | ICD-10-CM

## 2020-08-21 DIAGNOSIS — I2510 Atherosclerotic heart disease of native coronary artery without angina pectoris: Secondary | ICD-10-CM

## 2020-08-21 DIAGNOSIS — E782 Mixed hyperlipidemia: Secondary | ICD-10-CM

## 2020-08-21 DIAGNOSIS — I10 Essential (primary) hypertension: Secondary | ICD-10-CM

## 2020-08-21 DIAGNOSIS — M069 Rheumatoid arthritis, unspecified: Secondary | ICD-10-CM | POA: Diagnosis not present

## 2020-08-21 DIAGNOSIS — R011 Cardiac murmur, unspecified: Secondary | ICD-10-CM

## 2020-08-21 MED ORDER — ANORO ELLIPTA 62.5-25 MCG/INH IN AEPB: 1.0000 | INHALATION_SPRAY | Freq: Every day | RESPIRATORY_TRACT | 0 refills | Status: DC

## 2020-08-21 NOTE — Patient Instructions (Signed)
Medication Instructions:  Continue all current medications.   Labwork: none  Testing/Procedures: none  Follow-Up: 6 months   Any Other Special Instructions Will Be Listed Below (If Applicable).   If you need a refill on your cardiac medications before your next appointment, please call your pharmacy.  

## 2020-08-21 NOTE — Patient Instructions (Addendum)
Increase your prednisone to 15mg  daily  Start anoro ellipta inhaler 1 puff daily  Call us in 3 weeks to let us know how you are doing on the inhaler and prednisone  We will schedule you for a High Resolution CT Chest scan at your earliest convenience   Follow up in 3 months with breathing tests (spirometry and diffusion capacity)

## 2020-08-21 NOTE — Progress Notes (Signed)
Synopsis: Referred by Gar Ponto for shortness of breath  Subjective:   PATIENT ID: Lonnie Duffy GENDER: male DOB: 1941-02-11, MRN: [id number]   HPI  Chief Complaint  Patient presents with   Follow-up    ILD. PFT results.   Lonnie Duffy is a 79 year old male, never smoker with history of rheumatoid arthritis and CML who is returns to pulmonary clinic for shortness of breath.  He was diagnosed with rheumatoid arthritis 8 years ago and has been managed on methotrexate and 5mg  of prednisone daily for the joint pain.   The patient was diagnosed with CML in 01/2020 and was started on Dasatinib in 02/2020. By the end of May he was noted to be having increasing shortness of breath and wheezing. A CT chest scan 03/02/20 showed bilateral peripherally predominant patchy areas of ground glass attentuation, interstitial thickening and reticulation with mild traction bronchiectasis. Cardiac echo at this time showed EF 60-65%, mild aortic stenosis and mild left atrial dilation with normal RV function. 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  He had initial improvement in his breathing with the steroid taper but he continued to experience exertional shortness of breath with work. He was first seen in pulmonary clinic 07/03/20 and reports his breathing has been the same since then with no further improvement. He has remained on 10mg  of prednisone daily for the rheumatoid arthritis, which he has been experiencing pain in his hands.   He was last seen by hematology/oncology on 08/02/20. He is currently on 200mg  of Imatinib daily for the CML as he had an increase in his WBC count at that time. He continues to have some mucositis symptoms which are maintained with magic mouth wash.    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[place])    Coronary artery disease    Diabetes mellitus without complication (HCC)    Dyspnea    GERD (gastroesophageal reflux disease)    Hypertension    Palpitations      Family History  Problem Relation Age of Onset   Heart attack Mother    Heart attack Father    Diabetes Father      Social History   Socioeconomic History   Marital status: Married    Spouse name: Not on file   Number of children: Not on file   Years of education: Not on file   Highest education level: Not on file  Occupational History   Not on file  Tobacco Use   Smoking status: Never Smoker   Smokeless tobacco: Never Used  Vaping Use   Vaping Use: Never used  Substance and Sexual Activity   Alcohol use: Never   Drug use: Never   Sexual activity: Not on file  Other Topics Concern   Not on file  Social History Narrative   Not on file   Social Determinants of Health   Financial Resource Strain:    Difficulty of Paying Living Expenses: Not on file  Food Insecurity:    Worried About Running Out  of Food in the Last Year: Not on file   Ran Out of Food in the Last Year: Not on file  Transportation Needs:    Lack of Transportation (Medical): Not on file   Lack of Transportation (Non-Medical): Not on file  Physical Activity:    Days of Exercise per Week: Not on file   Minutes of Exercise per Session: Not on file  Stress:    Feeling of Stress : Not on file  Social Connections:    Frequency of Communication with Friends and Family: Not on file   Frequency of Social Gatherings with Friends and Family: Not on file   Attends Religious Services: Not on file   Active Member of Clubs or Organizations: Not on file   Attends  Archivist Meetings: Not on file   Marital Status: Not on file  Intimate Partner Violence:    Fear of Current or Ex-Partner: Not on file   Emotionally Abused: Not on file   Physically Abused: Not on file   Sexually Abused: Not on file     Allergies  Allergen Reactions   Eggs Or Egg-Derived Products    Flagyl [Metronidazole]    Penicillins Rash    Has patient had a PCN reaction causing immediate rash, facial/tongue/throat swelling, SOB or lightheadedness with hypotension: Yes Has patient had a PCN reaction causing severe rash involving mucus membranes or skin necrosis: No Has patient had a PCN reaction that required hospitalization: No Has patient had a PCN reaction occurring within the last 10 years: No If all of the above answers are "NO", then may proceed with Cephalosporin use.'     Outpatient Medications Prior to Visit  Medication Sig Dispense Refill   aspirin EC 81 MG tablet Take 162 mg by mouth daily.      atorvastatin (LIPITOR) 10 MG tablet Take 10 mg by mouth at bedtime.      Calcium-Magnesium-Vitamin D (CALCIUM 500 PO) Take 1 tablet by mouth 2 (two) times daily.     Cholecalciferol (VITAMIN D3) 5000 units TABS Take 5,000 Units by mouth daily.      clopidogrel (PLAVIX) 75 MG tablet TAKE 1 TABLET BY MOUTH EVERY DAY 90 tablet 1   cyclobenzaprine (FLEXERIL) 10 MG tablet Take 10 mg by mouth at bedtime as needed for muscle spasms.      fluconazole (DIFLUCAN) 150 MG tablet Take 150 mg by mouth daily.     fluticasone (FLONASE) 50 MCG/ACT nasal spray Place 2 sprays into both nostrils daily.     folic acid (FOLVITE) 1 MG tablet Take 1 mg by mouth daily.     gabapentin (NEURONTIN) 600 MG tablet Take 600 mg by mouth 3 (three) times daily.     losartan-hydrochlorothiazide (HYZAAR) 100-12.5 MG tablet Take 0.5 tablets by mouth daily.     Omega-3 Fatty Acids (FISH OIL) 1000 MG CAPS Take 2,000 mg by mouth 2 (two) times daily.      pantoprazole (PROTONIX) 40  MG tablet Take 40 mg by mouth daily.     polyethylene glycol (MIRALAX / GLYCOLAX) packet Take 17 g by mouth daily.     predniSONE (DELTASONE) 10 MG tablet Take 10 mg by mouth daily.     tamsulosin (FLOMAX) 0.4 MG CAPS capsule Take 0.4 mg by mouth daily.     valACYclovir (VALTREX) 1000 MG tablet Take 1,000 mg by mouth daily.     No facility-administered medications prior to visit.    Review of Systems  Constitutional: Negative for chills,  diaphoresis, fever, malaise/fatigue and weight loss.  HENT: Negative for congestion, nosebleeds, sinus pain and sore throat.   Eyes: Negative for blurred vision.  Respiratory: Positive for shortness of breath. Negative for cough, hemoptysis, sputum production and wheezing.   Cardiovascular: Negative for chest pain, palpitations, orthopnea, claudication, leg swelling and PND.  Gastrointestinal: Negative for abdominal pain, blood in stool, diarrhea, heartburn, melena, nausea and vomiting.  Genitourinary: Negative for dysuria, frequency and hematuria.  Musculoskeletal: Positive for joint pain. Negative for myalgias.  Skin: Negative for itching and rash.  Neurological: Negative for dizziness, weakness and headaches.  Endo/Heme/Allergies: Does not bruise/bleed easily.  Psychiatric/Behavioral: Negative.    Objective:   Vitals:   08/21/20 1413  BP: 132/66  Pulse: 96  Temp: 98.2 F (36.8 C)  TempSrc: Oral  SpO2: 97%  Weight: 185 lb (83.9 kg)  Height: 5' 6.5" (1.689 m)     Physical Exam Constitutional:      General: He is not in acute distress.    Appearance: Normal appearance. He is normal weight. He is not ill-appearing.  HENT:     Head: Normocephalic and atraumatic.     Nose: Nose normal.     Mouth/Throat:     Mouth: Mucous membranes are moist.     Pharynx: Oropharynx is clear.  Eyes:     Extraocular Movements: Extraocular movements intact.     Conjunctiva/sclera: Conjunctivae normal.     Pupils: Pupils are equal, round, and reactive  to light.  Cardiovascular:     Rate and Rhythm: Normal rate and regular rhythm.     Pulses: Normal pulses.     Heart sounds: No murmur heard.   Pulmonary:     Effort: Pulmonary effort is normal. No respiratory distress.     Breath sounds: Examination of the left-middle field reveals rales. Rales (dry) present. No wheezing or rhonchi.  Abdominal:     General: Abdomen is flat. Bowel sounds are normal. There is no distension.     Palpations: Abdomen is soft.     Tenderness: There is no abdominal tenderness.  Musculoskeletal:     Cervical back: Neck supple.     Right lower leg: No edema.     Left lower leg: No edema.  Lymphadenopathy:     Cervical: No cervical adenopathy.  Skin:    General: Skin is warm and dry.     Capillary Refill: Capillary refill takes less than 2 seconds.  Neurological:     General: No focal deficit present.     Mental Status: He is alert and oriented to person, place, and time. Mental status is at baseline.     Gait: Gait normal.  Psychiatric:        Mood and Affect: Mood normal.        Behavior: Behavior normal.        Thought Content: Thought content normal.        Judgment: Judgment normal.     CBC    Component Value Date/Time   WBC 7.4 06/29/2018 0247   RBC 3.79 (L) 06/29/2018 0247   HGB 11.6 (L) 06/29/2018 0247   HCT 34.9 (L) 06/29/2018 0247   PLT 237 06/29/2018 0247   MCV 92.1 06/29/2018 0247   MCH 30.6 06/29/2018 0247   MCHC 33.2 06/29/2018 0247   RDW 13.4 06/29/2018 0247     Chest imaging: 03/02/20 CT Chest: 1. Bilateral peripheral predominant patchy areas of ground-glass  attenuation, interstitial thickening and reticulation with mild  traction bronchiectasis. Findings compatible  with interstitial lung  disease, favor hypersensitivity pneumonitis. Cannot rule out drug  toxicity. No frank honeycombing identified at this point. Recommend  follow-up imaging in 6 months with high-resolution CT of the chest  to assess for any temporal  changes in the appearance of the lungs.  2. 3 mm right upper lobe lung nodule. No follow-up needed if patient  is low-risk. Non-contrast chest CT can be considered in 12 months if  patient is high-risk.  3. 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  4. Aortic atherosclerosis, in addition to 3 vessel coronary artery  disease. Please note that although the presence of coronary artery  calcium documents the presence of coronary artery disease, the  severity of this disease and any potential stenosis cannot be  assessed on this non-gated CT examination. Assessment for potential  risk factor modification, dietary therapy or pharmacologic therapy  may be warranted, if clinically indicated.   CXR 03/27/20 Bilateral interstitial prominence again noted. Interstitial changes  are less prominent than noted on prior CT of 03/02/2020  CXR 04/15/20 Lungs are hypoinflated with  stable elevation of the left hemidiaphragm. There is interval  worsening of patchy bilateral hazy mixed interstitial airspace  density. Note that a patchy bilateral ground-glass process was noted  on the recent previous CT. Linear atelectasis over the left  perihilar region. No effusion. Cardiomediastinal silhouette is  normal. Remainder of the exam is unchanged.   PFT:  FEV1/FVC 88, FEV1 2.22L (86%), FVC 2.53L (70%), TLC 5.2L (82%), DLCO 14.99 (67%)   Labs: 06/21/20 CBC: WBC 47.9, HGB 12, HCT 38.2, Plt 895 06/21/20 BMP: Na 135, K 3.9, Cl 99, Co2 26.8, Cr 1.56, BUN 18   08/02/20 CBC: WBC 32.2, HGB 11.7, HCT 36.3, PLT 983 08/02/20 BMP: Na 133, K 4.6, Cl 95, Co2 29.7, Cr 1.72, BUN 8   Echo: 03/02/20 1. Technically difficult study due to chest wall/lung interference.  2. The left ventricle is normal in size with normal wall thickness.  3. The left ventricular systolic function is normal, LVEF is visually    estimated at 60-65%.  4. Visually there is mild aortic valve stenosis.  5. The left atrium is mildly dilated in size.  6. The right ventricle is normal in size, with normal systolic function.   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  His shortness of breath appears to have plateaued and he may be at his new baseline in terms of his respiratory function. His pulmonary function tests show a mild diffusion defect but otherwise normal total lung capacity. His FVC is reduced concerning for a possible restrictive defect as well, but the TLC is reassuring. He did have some response to bronchodilators on the PFT so we will give him a sample of anoro today and he is to call us in 2-3 weeks to let us know if it is helping his shortness of breath.   Given his continued joint pains in his hands being off methotrexate, we will increase his prednisone to 15mg  per day and monitor for improvement in his symptoms.   We will check a high resolution CT chest since it has been 6  months since his previous CT chest scan.   He does not have ambulatory oxygen desaturations after completing 3 laps (794ft) in our office today as he maintained O2 saturations 96-97%. He does not require supplemental oxygen.   Should he develop progressive shortness of breath, we will have him back in clinic sooner.   Follow up in 3 months.  Freda Jackson, MD [facility] Office: [phone]    Current Outpatient Medications:    aspirin EC 81 MG tablet, Take 162 mg by mouth daily. , Disp: , Rfl:    atorvastatin (LIPITOR) 10 MG tablet, Take 10 mg by mouth at bedtime. , Disp: , Rfl:     Calcium-Magnesium-Vitamin D (CALCIUM 500 PO), Take 1 tablet by mouth 2 (two) times daily., Disp: , Rfl:    Cholecalciferol (VITAMIN D3) 5000 units TABS, Take 5,000 Units by mouth daily. , Disp: , Rfl:    clopidogrel (PLAVIX) 75 MG tablet, TAKE 1 TABLET BY MOUTH EVERY DAY, Disp: 90 tablet, Rfl: 1   cyclobenzaprine (FLEXERIL) 10 MG tablet, Take 10 mg by mouth at bedtime as needed for muscle spasms. , Disp: , Rfl:    fluconazole (DIFLUCAN) 150 MG tablet, Take 150 mg by mouth daily., Disp: , Rfl:    fluticasone (FLONASE) 50 MCG/ACT nasal spray, Place 2 sprays into both nostrils daily., Disp: , Rfl:    folic acid (FOLVITE) 1 MG tablet, Take 1 mg by mouth daily., Disp: , Rfl:    gabapentin (NEURONTIN) 600 MG tablet, Take 600 mg by mouth 3 (three) times daily., Disp: , Rfl:    losartan-hydrochlorothiazide (HYZAAR) 100-12.5 MG tablet, Take 0.5 tablets by mouth daily., Disp: , Rfl:    Omega-3 Fatty Acids (FISH OIL) 1000 MG CAPS, Take 2,000 mg by mouth 2 (two) times daily. , Disp: , Rfl:    pantoprazole (PROTONIX) 40 MG tablet, Take 40 mg by mouth daily., Disp: , Rfl:    polyethylene glycol (MIRALAX / GLYCOLAX) packet, Take 17 g by mouth daily., Disp: , Rfl:    predniSONE (DELTASONE) 10 MG tablet, Take 10 mg by mouth daily., Disp: , Rfl:    tamsulosin (FLOMAX) 0.4 MG CAPS capsule, Take 0.4 mg by mouth daily., Disp: , Rfl:    valACYclovir (VALTREX) 1000 MG tablet, Take 1,000 mg by mouth daily., Disp: , Rfl:

## 2020-08-21 NOTE — Progress Notes (Signed)
PFT done today. 

## 2020-08-22 ENCOUNTER — Telehealth: Payer: Self-pay | Admitting: Pulmonary Disease

## 2020-08-22 LAB — PULMONARY FUNCTION TEST
DL/VA % pred: 91 %
DL/VA: 3.67 ml/min/mmHg/L
DLCO cor % pred: 67 %
DLCO cor: 14.99 ml/min/mmHg
DLCO unc % pred: 67 %
DLCO unc: 14.99 ml/min/mmHg
FEF 25-75 Post: 3.79 L/sec
FEF 25-75 Pre: 2.84 L/sec
FEF2575-%Change-Post: 33 %
FEF2575-%Pred-Post: 209 %
FEF2575-%Pred-Pre: 157 %
FEV1-%Change-Post: 7 %
FEV1-%Pred-Post: 86 %
FEV1-%Pred-Pre: 80 %
FEV1-Post: 2.22 L
FEV1-Pre: 2.06 L
FEV1FVC-%Change-Post: 3 %
FEV1FVC-%Pred-Pre: 117 %
FEV6-%Change-Post: 4 %
FEV6-%Pred-Post: 76 %
FEV6-%Pred-Pre: 72 %
FEV6-Post: 2.53 L
FEV6-Pre: 2.43 L
FEV6FVC-%Pred-Post: 107 %
FEV6FVC-%Pred-Pre: 107 %
FVC-%Change-Post: 4 %
FVC-%Pred-Post: 70 %
FVC-%Pred-Pre: 67 %
FVC-Post: 2.53 L
FVC-Pre: 2.43 L
Post FEV1/FVC ratio: 88 %
Post FEV6/FVC ratio: 100 %
Pre FEV1/FVC ratio: 85 %
Pre FEV6/FVC Ratio: 100 %
RV % pred: 76 %
RV: 1.84 L
TLC % pred: 82 %
TLC: 5.2 L

## 2020-08-22 NOTE — Telephone Encounter (Signed)
Called and spoke with pt letting him know the info stated by Dr. Erin Fulling and let him know that he needs to go get tested for both covid as well as flu. Pt verbalized understanding. Stated to pt to call us once he has the results. Nothing further needed.

## 2020-08-22 NOTE — Telephone Encounter (Signed)
Please let the patient know that him or his family members who are sick should go get tested for covid and influenza. If this is covid, then he would benefit from outpatient infusion of monoclonal antibodies and possibly remdesivir.   Thanks, Freda Jackson, MD [facility] Office: [phone]   See Amion for Pager Details

## 2020-08-22 NOTE — Telephone Encounter (Signed)
He would benefit from from the monoclonal antibody infusion as well as remdesevir if possible. He lives closer to [facility] if these therapies are available there.   He was diagnosed with CML earlier this year and was started on dasatinib in which he unfortunately developed pneumonitis reaction which has left him presently with a mild diffusion defect. He remains fairly symptomatic with exertion. He can't afford to take another hit from Covid so if we could get him setup for these therapies starting 11/11 that would be helpful.   Please reach out to me on my cell if anything else is needed.

## 2020-08-22 NOTE — Telephone Encounter (Signed)
Attempted to call pt but unable to reach. Left message for him to return call.  Due to the message that was sent by pt back to triage stating that pt has tested positive for covid and would like info on infusion treatments, sending this to Dr. Erin Fulling as well as Rexene Edison who also helps out with getting pts scheduled for the infusions.  Please advise.

## 2020-08-22 NOTE — Telephone Encounter (Signed)
email has been sent to the [facility] hotline for pt to have infusion.

## 2020-08-22 NOTE — Telephone Encounter (Signed)
Pt saw JD yesterday and chest CT order was placed.  I called him this morning to give him CT appt info.  The first thing he asked was "when is it - I think I have the flu". I told him it isn't until 12/3 and then I said you saw him yesterday and you think you have the flu and he said "yes either that or the virus". He did not ask for any assistance so I did not place phone note at the time.  I made Lauren aware and she suggested putting in a high priority telephone message and sending to triage.

## 2020-08-23 ENCOUNTER — Telehealth: Payer: Self-pay | Admitting: Nurse Practitioner

## 2020-08-23 NOTE — Telephone Encounter (Signed)
Received email from [facility] stating that they were going to call pt today 11/11.

## 2020-08-23 NOTE — Telephone Encounter (Signed)
Called to Discuss with patient about Covid symptoms and the use of the monoclonal antibody infusion for those with mild to moderate Covid symptoms and at a high risk of hospitalization.     Pt appears to qualify for this infusion due to co-morbid conditions and/or a member of an at-risk group in accordance with the FDA Emergency Use Authorization.    Unable to reach pt - left message for patient to return call

## 2020-09-14 ENCOUNTER — Other Ambulatory Visit: Payer: Self-pay

## 2020-09-14 ENCOUNTER — Ambulatory Visit (HOSPITAL_COMMUNITY)
Admission: RE | Admit: 2020-09-14 | Discharge: 2020-09-14 | Disposition: A | Discharge status: Home / Self Care | Payer: Medicare Other | Source: Ambulatory Visit | Attending: Pulmonary Disease | Admitting: Pulmonary Disease

## 2020-09-14 DIAGNOSIS — J849 Interstitial pulmonary disease, unspecified: Secondary | ICD-10-CM | POA: Diagnosis present

## 2021-01-29 ENCOUNTER — Other Ambulatory Visit: Payer: Self-pay

## 2021-01-29 ENCOUNTER — Ambulatory Visit (INDEPENDENT_AMBULATORY_CARE_PROVIDER_SITE_OTHER): Payer: Medicare Other | Admitting: Urology

## 2021-01-29 ENCOUNTER — Encounter: Payer: Self-pay | Admitting: Urology

## 2021-01-29 VITALS — BP 134/64 | HR 113 | Temp 98.2°F | Ht 67.0 in | Wt 180.0 lb

## 2021-01-29 DIAGNOSIS — N433 Hydrocele, unspecified: Secondary | ICD-10-CM | POA: Diagnosis not present

## 2021-01-29 NOTE — Progress Notes (Signed)
01/29/2021 3:37 PM   [name] 1941/10/10 [id number]  Referring provider: Caryl Bis, MD [place],  [place] [address]  Right scrotal swelling  HPI: Mr Okimoto is a 80yo here for evaluation of right scrotal swelling. He noticed the swelling started 3 months after a fall down stairs. He has pain with ambulation related to the right scrotal swelling. No hx of inguinal hernia. IPSS 13 QOL 2 on flomax. He is on plavix for CAD with cardiac stents placed over 2 years ago.     PMH: Past Medical History:  Diagnosis Date  . ASCVD (arteriosclerotic cardiovascular disease)   . Chest pain   . Collagen vascular disease ([place])   . Complication of anesthesia    "I had local anesthesia , and I went out for 3 hrs "  . 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 Coronary artery disease   . Diabetes mellitus without complication ([place])   . Dyspnea   . GERD (gastroesophageal reflux disease)   . Hypertension   . Palpitations     Surgical History: Past Surgical History:  Procedure Laterality Date  . APPENDECTOMY  1987  . CARDIAC CATHETERIZATION    . CATARACT EXTRACTION  11/2009 & 12/2015  . CORONARY STENT INTERVENTION  06/28/2018  . CORONARY STENT INTERVENTION N/A 06/28/2018   Procedure: CORONARY STENT INTERVENTION;  Surgeon: Nelva Bush, MD;  Location: [facility];  Service: Cardiovascular;  Laterality: N/A;  . GALLBLADDER SURGERY  1989  . INTRAVASCULAR ULTRASOUND/IVUS N/A 06/28/2018   Procedure: Intravascular Ultrasound/IVUS;  Surgeon: Nelva Bush, MD;  Location: [facility];  Service: Cardiovascular;  Laterality: N/A;  . NASAL SINUS SURGERY  11/21/1987  . RIGHT/LEFT HEART CATH AND CORONARY ANGIOGRAPHY N/A 06/28/2018   Procedure: RIGHT/LEFT HEART CATH AND CORONARY ANGIOGRAPHY;  Surgeon: Nelva Bush, MD;  Location: [facility];  Service: Cardiovascular;  Laterality: N/A;  . SHOULDER SURGERY  2007  . stent in lad  06/07/2004  . TONSILLECTOMY  1971  .  trigerfinger  1990    Home Medications:  Allergies as of 01/29/2021      Reactions   Eggs Or Egg-derived Products    Flagyl [metronidazole]    Penicillins Rash   Has patient had a PCN reaction causing immediate rash, facial/tongue/throat swelling, SOB or lightheadedness with hypotension: Yes Has patient had a PCN reaction causing severe rash involving mucus membranes or skin necrosis: No Has patient had a PCN reaction that required hospitalization: No Has patient had a PCN reaction occurring within the last 10 years: No If all of the above answers are "NO", then may proceed with Cephalosporin use.'      Medication List       Accurate as of January 29, 2021  3:37 PM. If you have any questions, ask your nurse or doctor.        Anoro Ellipta 62.5-25 MCG/INH Aepb Generic drug: umeclidinium-vilanterol Inhale 1 puff into the lungs daily.   aspirin EC 81 MG tablet Take 162 mg by mouth daily.   atorvastatin 10 MG tablet Commonly known as: LIPITOR Take 10 mg by mouth at bedtime.   CALCIUM 500 PO Take 1 tablet by mouth 2 (two) times daily.   clopidogrel 75 MG tablet Commonly known as: PLAVIX TAKE 1 TABLET BY MOUTH EVERY DAY   cyclobenzaprine 10 MG tablet Commonly known as: FLEXERIL Take 10 mg by mouth at bedtime as needed for muscle spasms.   Fish Oil 1000 MG Caps  Take 2,000 mg by mouth 2 (two) times daily.   fluconazole 150 MG tablet Commonly known as: DIFLUCAN Take 150 mg by mouth daily.   fluticasone 50 MCG/ACT nasal spray Commonly known as: FLONASE Place 2 sprays into both nostrils daily.   folic acid 1 MG tablet Commonly known as: FOLVITE Take 1 mg by mouth daily.   gabapentin 600 MG tablet Commonly known as: NEURONTIN Take 600 mg by mouth 3 (three) times daily.   losartan-hydrochlorothiazide 100-12.5 MG tablet Commonly known as: HYZAAR Take 0.5 tablets by mouth daily.   ondansetron 8 MG tablet Commonly known as: ZOFRAN Take 8 mg by mouth every 8 (eight)  hours as needed.   pantoprazole 40 MG tablet Commonly known as: PROTONIX Take 40 mg by mouth daily.   Phos-NaK 280-160-250 MG Pack Generic drug: potassium & sodium phosphates Take 1 packet by mouth 2 (two) times daily.   polyethylene glycol 17 g packet Commonly known as: MIRALAX / GLYCOLAX Take 17 g by mouth daily.   predniSONE 10 MG tablet Commonly known as: DELTASONE Take 10 mg by mouth daily.   tamsulosin 0.4 MG Caps capsule Commonly known as: FLOMAX Take 0.4 mg by mouth daily.   valACYclovir 1000 MG tablet Commonly known as: VALTREX Take 1,000 mg by mouth daily.   Vitamin D3 125 MCG (5000 UT) Tabs Take 5,000 Units by mouth daily.       Allergies:  Allergies  Allergen Reactions  . Eggs Or Egg-Derived Products   . Flagyl [Metronidazole]   . Penicillins Rash    Has patient had a PCN reaction causing immediate rash, facial/tongue/throat swelling, SOB or lightheadedness with hypotension: Yes Has patient had a PCN reaction causing severe rash involving mucus membranes or skin necrosis: No Has patient had a PCN reaction that required hospitalization: No Has patient had a PCN reaction occurring within the last 10 years: No If all of the above answers are "NO", then may proceed with Cephalosporin use.'    Family History: Family History  Problem Relation Age of Onset  . Heart attack Mother   . Heart attack Father   . Diabetes Father     Social History:  reports that he has never smoked. He has never used smokeless tobacco. He reports that he does not drink alcohol and does not use drugs.  ROS: All other review of systems were reviewed and are negative except what is noted above in HPI  Physical Exam: BP 134/64   Pulse (!) 113   Temp 98.2 F (36.8 C)   Ht 5\' 7"  (1.702 m)   Wt 180 lb (81.6 kg)   BMI 28.19 kg/m   Constitutional:  Alert and oriented, No acute distress. HEENT: [place] AT, moist mucus membranes.  Trachea midline, no masses. Cardiovascular: No  clubbing, cyanosis, or edema. Respiratory: Normal respiratory effort, no increased work of breathing. GI: Abdomen is soft, nontender, nondistended, no abdominal masses GU: No CVA tenderness. Uncircumcised phallus. No masses/lesions on penis, testis, scrotum. 8cm right hydrocele Lymph: No cervical or inguinal lymphadenopathy. Skin: No rashes, bruises or suspicious lesions. Neurologic: Grossly intact, no focal deficits, moving all 4 extremities. Psychiatric: Normal mood and affect.  Laboratory Data: Lab Results  Component Value Date   WBC 7.4 06/29/2018   HGB 11.6 (L) 06/29/2018   HCT 34.9 (L) 06/29/2018   MCV 92.1 06/29/2018   PLT 237 06/29/2018    Lab Results  Component Value Date   CREATININE 1.09 06/29/2018    No results found for:  PSA  No results found for: TESTOSTERONE  No results found for: HGBA1C  Urinalysis No results found for: COLORURINE, APPEARANCEUR, LABSPEC, PHURINE, GLUCOSEU, HGBUR, BILIRUBINUR, KETONESUR, PROTEINUR, UROBILINOGEN, NITRITE, LEUKOCYTESUR  No results found for: LABMICR, [place], RBCUA, LABEPIT, MUCUS, BACTERIA  Pertinent Imaging:  No results found for this or any previous visit.  No results found for this or any previous visit.  No results found for this or any previous visit.  No results found for this or any previous visit.  No results found for this or any previous visit.  No results found for this or any previous visit.  No results found for this or any previous visit.  No results found for this or any previous visit.   Assessment & Plan:    1. Hydrocele, unspecified hydrocele type -We discussed observation, aspiration and hydrocelectomy and after discussing the options the patient elects for right hydrocelectomy. Risks/benefits/alternatives discussed.  - Urinalysis, Routine w reflex microscopic   No follow-ups on file.  Nicolette Bang, MD  [facility] [place]

## 2021-01-29 NOTE — Patient Instructions (Signed)
Hydrocelectomy, Adult  A hydrocelectomy is a surgical procedure to remove a collection of fluid (hydrocele) from the scrotum, which is the pouch that holds the testicles. You may need to have this procedure if a hydrocele is causing painful swelling in your scrotum. Tell a health care provider about:  Any allergies you have.  All medicines you are taking, including vitamins, herbs, eye drops, creams, and over-the-counter medicines.  Any problems you or family members have had with anesthetic medicines.  Any blood disorders you have.  Any surgeries you have had.  Any medical conditions you have. What are the risks? Generally, this is a safe procedure. However, problems may occur, including:  Bleeding into the scrotum (scrotal hematoma).  Damage to nearby structures or organs, including to the testicle or the tube that carries sperm out of the testicle (vas deferens).  Infection.  Allergic reactions to medicines. What happens before the procedure? Staying hydrated Follow instructions from your health care provider about hydration, which may include:  Up to 2 hours before the procedure - you may continue to drink clear liquids, such as water, clear fruit juice, black coffee, and plain tea. Eating and drinking restrictions Follow instructions from your health care provider about eating and drinking, which may include:  8 hours before the procedure - stop eating heavy meals or foods, such as meat, fried foods, or fatty foods.  6 hours before the procedure - stop eating light meals or foods, such as toast or cereal.  6 hours before the procedure - stop drinking milk or drinks that contain milk.  2 hours before the procedure - stop drinking clear liquids. Medicines Ask your health care provider about:  Changing or stopping your regular medicines. This is especially important if you are taking diabetes medicines or blood thinners.  Taking medicines such as aspirin and ibuprofen.  These medicines can thin your blood. Do not take these medicines unless your health care provider tells you to take them.  Taking over-the-counter medicines, vitamins, herbs, and supplements. General instructions  Do not use any products that contain nicotine or tobacco for at least 4 weeks before the procedure. These products include cigarettes, e-cigarettes, and chewing tobacco. If you need help quitting, ask your health care provider.  Plan to have someone take you home from the hospital or clinic.  Plan to have a responsible adult care for you for at least 24 hours after you leave the hospital or clinic. This is important.  Ask your health care provider: ? How your surgery site will be marked. ? What steps will be taken to help prevent infection. These may include:  Removing hair at the surgery site.  Washing skin with a germ-killing soap.  Taking antibiotic medicine. What happens during the procedure?  An IV will be inserted into one of your veins.  You will be given one or more of the following: ? A medicine to make you relax (sedative). ? A medicine to make you fall asleep (general anesthetic).  A small incision will be made through the skin of your scrotum.  Your testicle and the hydrocele will be located, and the hydrocele sac will be opened with an incision.  The fluid will be drained from the hydrocele. Part of the hydrocele sac may be removed.  The hydrocele will be closed with stitches that dissolve (absorbable sutures). This prevents fluid from building up again.  If your hydrocele is large, you may have a thin, rubber drain placed to allow fluid to drain  after the procedure.  The incision in your scrotum will be closed with absorbable sutures, skin glue, or adhesives.  A bandage (dressing) will be placed over the incision. The dressing may be held in place with an athletic support strap (scrotal support). The procedure may vary among health care providers and  hospitals. What happens after the procedure?  Your blood pressure, heart rate, breathing rate, and blood oxygen level will be monitored until you leave the hospital or clinic.  You will be given pain medicine as needed.  Your IV will be removed, and your insertion site will be checked for bleeding.  Do not drive for 24 hours if you were given a sedative during your procedure.  You may need to wear a scrotal support. This holds the dressing in place and supports your scrotum.   Summary  A hydrocelectomy is a surgical procedure to remove a collection of fluid (hydrocele) from the scrotum, which is the pouch that holds the testicles. You may need to have this procedure if a hydrocele is causing painful swelling in your scrotum.  During the procedure, the hydrocele will be drained and then closed with stitches that dissolve (absorbable sutures). This prevents fluid from building up again.  If your hydrocele is large, you may have a thin, rubber drain placed to allow fluid to drain after the procedure.  You may need to wear a scrotal support after your procedure. This holds the dressing in place and supports your scrotum. This information is not intended to replace advice given to you by your health care provider. Make sure you discuss any questions you have with your health care provider. Document Revised: 02/22/2019 Document Reviewed: 02/22/2019 Elsevier Patient Education  [place].

## 2021-01-29 NOTE — Progress Notes (Signed)
Urological Symptom Review  Patient is experiencing the following symptoms: Get up at night to urinate Leakage of urine Erection problems (male only)   Review of Systems  Gastrointestinal (upper)  : Nausea  Gastrointestinal (lower) : Constipation  Constitutional : Fatigue  Skin: Skin rash/lesion  itching  Eyes: Negative for eye symptoms  Ear/Nose/Throat : Sinus problems  Hematologic/Lymphatic: Easy bruising  Cardiovascular : Negative for cardiovascular symptoms  Respiratory : Shortness of breath  Endocrine: Excessive thirst  Musculoskeletal: Back pain Joint pain  Neurological: Negative for neurological symptoms  Psychologic: Negative for psychiatric symptoms

## 2021-01-30 LAB — URINALYSIS, ROUTINE W REFLEX MICROSCOPIC
Bilirubin, UA: NEGATIVE
Ketones, UA: NEGATIVE
Leukocytes,UA: NEGATIVE
Nitrite, UA: NEGATIVE
Protein,UA: NEGATIVE
RBC, UA: NEGATIVE
Specific Gravity, UA: <1.005 — ABNORMAL LOW (ref 1.005–1.030)
Urobilinogen, Ur: 0.2 mg/dL (ref 0.2–1.0)
pH, UA: 6 (ref 5.0–7.5)

## 2021-02-04 ENCOUNTER — Telehealth: Payer: Self-pay | Admitting: Cardiology

## 2021-02-04 NOTE — Progress Notes (Signed)
Surgical clearance letter sent to Dr. Harl Bowie for pending plavix hold prior to surgery.

## 2021-02-04 NOTE — Telephone Encounter (Signed)
   [facility] Pre-operative Risk Assessment    Patient Name: Lonnie Duffy  DOB: Dec 29, 1940  MRN: [id number]   HEARTCARE STAFF: - Please ensure there is not already an duplicate clearance open for this procedure. - Under Visit Info/Reason for Call, type in Other and utilize the format Clearance MM/DD/YY or Clearance TBD. Do not use dashes or single digits. - If request is for dental extraction, please clarify the # of teeth to be extracted.  Request for surgical clearance:  1. What type of surgery is being performed? Right hydrocelectomy procedure   2. When is this surgery scheduled? 03/18/21  3. What type of clearance is required (medical clearance vs. Pharmacy clearance to hold med vs. Both)? both  4. Are there any medications that need to be held prior to surgery and how long? plavix 5 days prior   5. Practice name and name of physician performing surgery? Dr Alyson Ingles   6. What is the office phone number? [phone]    7.   What is the office fax number? [phone]  4.   Anesthesia type (None, local, MAC, general) ? general   Jannet Askew 02/04/2021, 4:23 PM  _________________________________________________________________   (provider comments below)

## 2021-02-06 NOTE — Telephone Encounter (Signed)
Patient has upcoming visit with Dr. Harl Bowie on 02/21/2021, will defer final clearance to MD after the office visit.  Surgery is on 03/18/2021.

## 2021-02-21 ENCOUNTER — Ambulatory Visit: Payer: Medicare Other | Admitting: Cardiology

## 2021-02-21 ENCOUNTER — Encounter: Payer: Self-pay | Admitting: *Deleted

## 2021-02-21 ENCOUNTER — Encounter: Payer: Self-pay | Admitting: Cardiology

## 2021-02-21 VITALS — BP 146/70 | HR 92 | Ht 66.0 in | Wt 181.8 lb

## 2021-02-21 DIAGNOSIS — Z01810 Encounter for preprocedural cardiovascular examination: Secondary | ICD-10-CM | POA: Diagnosis not present

## 2021-02-21 DIAGNOSIS — E782 Mixed hyperlipidemia: Secondary | ICD-10-CM

## 2021-02-21 DIAGNOSIS — I10 Essential (primary) hypertension: Secondary | ICD-10-CM

## 2021-02-21 DIAGNOSIS — I2510 Atherosclerotic heart disease of native coronary artery without angina pectoris: Secondary | ICD-10-CM | POA: Diagnosis not present

## 2021-02-21 NOTE — Patient Instructions (Signed)
Your physician recommends that you schedule a follow-up appointment in: [place]  Your physician recommends that you continue on your current medications as directed. Please refer to the Current Medication list given to you today.  Thank you for choosing [facility]!!   [email or web address]">  DASH Eating Plan DASH stands for Dietary Approaches to Stop Hypertension. The DASH eating plan is a healthy eating plan that has been shown to:  Reduce high blood pressure (hypertension).  Reduce your risk for type 2 diabetes, heart disease, and stroke.  Help with weight loss. What are tips for following this plan? Reading food labels  Check food labels for the amount of salt (sodium) per serving. Choose foods with less than 5 percent of the Daily Value of sodium. Generally, foods with less than 300 milligrams (mg) of sodium per serving fit into this eating plan.  To find whole grains, look for the word "whole" as the first word in the ingredient list. Shopping  Buy products labeled as "low-sodium" or "no salt added."  Buy fresh foods. Avoid canned foods and pre-made or frozen meals. Cooking  Avoid adding salt when cooking. Use salt-free seasonings or herbs instead of table salt or sea salt. Check with your health care provider or pharmacist before using salt substitutes.  Do not fry foods. Cook foods using healthy methods such as baking, boiling, grilling, roasting, and broiling instead.  Cook with heart-healthy oils, such as olive, canola, avocado, soybean, or sunflower oil. Meal planning  Eat a balanced diet that includes: ? 4 or more servings of fruits and 4 or more servings of vegetables each day. Try to fill one-half of your plate with fruits and vegetables. ? 6-8 servings of whole grains each day. ? Less than 6 oz (170 g) of lean meat, poultry, or fish each day. A 3-oz (85-g) serving of meat is about the same size  as a deck of cards. One egg equals 1 oz (28 g). ? 2-3 servings of low-fat dairy each day. One serving is 1 cup (237 mL). ? 1 serving of nuts, seeds, or beans 5 times each week. ? 2-3 servings of heart-healthy fats. Healthy fats called omega-3 fatty acids are found in foods such as walnuts, flaxseeds, fortified milks, and eggs. These fats are also found in cold-water fish, such as sardines, salmon, and mackerel.  Limit how much you eat of: ? Canned or prepackaged foods. ? Food that is high in trans fat, such as some fried foods. ? Food that is high in saturated fat, such as fatty meat. ? Desserts and other sweets, sugary drinks, and other foods with added sugar. ? Full-fat dairy products.  Do not salt foods before eating.  Do not eat more than 4 egg yolks a week.  Try to eat at least 2 vegetarian meals a week.  Eat more home-cooked food and less restaurant, buffet, and fast food.   Lifestyle  When eating at a restaurant, ask that your food be prepared with less salt or no salt, if possible.  If you drink alcohol: ? Limit how much you use to:  0-1 drink a day for women who are not pregnant.  0-2 drinks a day for men. ? Be aware of how much alcohol is in your drink. In the [place] one drink equals one 12 oz bottle of beer (355 mL), one 5 oz glass of wine (148 mL), or one 1 oz glass of hard liquor (44 mL). General information  Avoid eating  more than 2,300 mg of salt a day. If you have hypertension, you may need to reduce your sodium intake to 1,500 mg a day.  Work with your health care provider to maintain a healthy body weight or to lose weight. Ask what an ideal weight is for you.  Get at least 30 minutes of exercise that causes your heart to beat faster (aerobic exercise) most days of the week. Activities may include walking, swimming, or biking.  Work with your health care provider or dietitian to adjust your eating plan to your individual calorie needs. What foods should I  eat? Fruits All fresh, dried, or frozen fruit. Canned fruit in natural juice (without added sugar). Vegetables Fresh or frozen vegetables (raw, steamed, roasted, or grilled). Low-sodium or reduced-sodium tomato and vegetable juice. Low-sodium or reduced-sodium tomato sauce and tomato paste. Low-sodium or reduced-sodium canned vegetables. Grains Whole-grain or whole-wheat bread. Whole-grain or whole-wheat pasta. Brown rice. [place]. Bulgur. Whole-grain and low-sodium cereals. Pita bread. Low-fat, low-sodium crackers. Whole-wheat flour tortillas. Meats and other proteins Skinless chicken or [place]. Ground chicken or [place]. Pork with fat trimmed off. Fish and seafood. Egg whites. Dried beans, peas, or lentils. Unsalted nuts, nut butters, and seeds. Unsalted canned beans. Lean cuts of beef with fat trimmed off. Low-sodium, lean precooked or cured meat, such as sausages or meat loaves. Dairy Low-fat (1%) or fat-free (skim) milk. Reduced-fat, low-fat, or fat-free cheeses. Nonfat, low-sodium ricotta or cottage cheese. Low-fat or nonfat yogurt. Low-fat, low-sodium cheese. Fats and oils Soft margarine without trans fats. Vegetable oil. Reduced-fat, low-fat, or light mayonnaise and salad dressings (reduced-sodium). Canola, safflower, olive, avocado, soybean, and sunflower oils. Avocado. Seasonings and condiments Herbs. Spices. Seasoning mixes without salt. Other foods Unsalted popcorn and pretzels. Fat-free sweets. The items listed above may not be a complete list of foods and beverages you can eat. Contact a dietitian for more information. What foods should I avoid? Fruits Canned fruit in a light or heavy syrup. Fried fruit. Fruit in cream or butter sauce. Vegetables Creamed or fried vegetables. Vegetables in a cheese sauce. Regular canned vegetables (not low-sodium or reduced-sodium). Regular canned tomato sauce and paste (not low-sodium or reduced-sodium). Regular tomato and vegetable juice  (not low-sodium or reduced-sodium). Angie Fava. Olives. Grains Baked goods made with fat, such as croissants, muffins, or some breads. Dry pasta or rice meal packs. Meats and other proteins Fatty cuts of meat. Ribs. Fried meat. Berniece Salines. Bologna, salami, and other precooked or cured meats, such as sausages or meat loaves. Fat from the back of a pig (fatback). Bratwurst. Salted nuts and seeds. Canned beans with added salt. Canned or smoked fish. Whole eggs or egg yolks. Chicken or [place] with skin. Dairy Whole or 2% milk, cream, and half-and-half. Whole or full-fat cream cheese. Whole-fat or sweetened yogurt. Full-fat cheese. Nondairy creamers. Whipped toppings. Processed cheese and cheese spreads. Fats and oils Butter. Stick margarine. Lard. Shortening. Ghee. Bacon fat. Tropical oils, such as coconut, palm kernel, or palm oil. Seasonings and condiments Onion salt, garlic salt, seasoned salt, table salt, and sea salt. Worcestershire sauce. Tartar sauce. Barbecue sauce. Teriyaki sauce. Soy sauce, including reduced-sodium. Steak sauce. Canned and packaged gravies. Fish sauce. Oyster sauce. Cocktail sauce. Store-bought horseradish. Ketchup. Mustard. Meat flavorings and tenderizers. Bouillon cubes. Hot sauces. Pre-made or packaged marinades. Pre-made or packaged taco seasonings. Relishes. Regular salad dressings. Other foods Salted popcorn and pretzels. The items listed above may not be a complete list of foods and beverages you should avoid. Contact a dietitian  for more information. Where to find more information  National Heart, Lung, and Blood Institute: https://wilson-eaton.com/  American Heart Association: www.heart.org  Academy of Nutrition and Dietetics: www.eatright.[place]: www.kidney.org Summary  The DASH eating plan is a healthy eating plan that has been shown to reduce high blood pressure (hypertension). It may also reduce your risk for type 2 diabetes, heart disease, and  stroke.  When on the DASH eating plan, aim to eat more fresh fruits and vegetables, whole grains, lean proteins, low-fat dairy, and heart-healthy fats.  With the DASH eating plan, you should limit salt (sodium) intake to 2,300 mg a day. If you have hypertension, you may need to reduce your sodium intake to 1,500 mg a day.  Work with your health care provider or dietitian to adjust your eating plan to your individual calorie needs. This information is not intended to replace advice given to you by your health care provider. Make sure you discuss any questions you have with your health care provider. Document Revised: 09/02/2019 Document Reviewed: 09/02/2019 Elsevier Patient Education  2021 [employer].

## 2021-02-21 NOTE — Progress Notes (Signed)
Clinical Summary Mr. Burandt is a 80 y.o.male former patient of Dr Bronson Ing, this is our first visit together. Seen for the following medical problems.  1. CAD - history of DES x 2 to LAD in 2019 - from notes interventional had recommended indefinite DAPT - 02/2020 echo LVEF 60-65%  - no recent chest pain. Chronic DOE that is stable - walks daily up 15 minutes, tolerates without troubles. Walks up flight of stairs without any exertional symptoms.  - compliant with meds  2. Hyperlipidemia - labs followed by pcp - he is on statin. Higher atorvastatin doses causes muscle aches  3. HTN - compliant with meds - checks at home daily 140s/70s first thing in AM.   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 Aortic stenosis - very mild by 02/2020 echo. Mean grad 9, AVA VTI 2.5   6. Preoperative evaluation - considering right hydrocelectomy - - walks daily up 15 minutes, tolerates without troubles. Walks up flight of stairs without any exertional symptoms.    Past Medical History:  Diagnosis Date  . ASCVD (arteriosclerotic cardiovascular disease)   . Chest pain   . Collagen vascular disease ([place])   . Complication of anesthesia    "I had local anesthesia , and I went out for 3 hrs "  . 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 Coronary artery disease   . Diabetes mellitus without complication ([place])   . Dyspnea   . GERD (gastroesophageal reflux disease)   . Hypertension   . Palpitations      Allergies  Allergen Reactions  . Eggs Or Egg-Derived Products   . Flagyl [Metronidazole]   . Penicillins Rash    Has patient had a PCN reaction causing immediate rash, facial/tongue/throat swelling, SOB or lightheadedness with hypotension: Yes Has patient had a PCN reaction causing severe rash involving mucus membranes or skin necrosis: No Has patient had a PCN reaction that required hospitalization: No Has patient had a PCN reaction occurring within the last 10 years: No If all of the  above answers are "NO", then may proceed with Cephalosporin use.'     Current Outpatient Medications  Medication Sig Dispense Refill  . aspirin EC 81 MG tablet Take 162 mg by mouth daily.     Marland Kitchen atorvastatin (LIPITOR) 10 MG tablet Take 10 mg by mouth at bedtime.     . Calcium-Magnesium-Vitamin D (CALCIUM 500 PO) Take 1 tablet by mouth 2 (two) times daily.    . Cholecalciferol (VITAMIN D3) 5000 units TABS Take 5,000 Units by mouth daily.     . clopidogrel (PLAVIX) 75 MG tablet TAKE 1 TABLET BY MOUTH EVERY DAY 90 tablet 1  . cyclobenzaprine (FLEXERIL) 10 MG tablet Take 10 mg by mouth at bedtime as needed for muscle spasms.     . fluconazole (DIFLUCAN) 150 MG tablet Take 150 mg by mouth daily.    . fluticasone (FLONASE) 50 MCG/ACT nasal spray Place 2 sprays into both nostrils daily.    . folic acid (FOLVITE) 1 MG tablet Take 1 mg by mouth daily.    Marland Kitchen gabapentin (NEURONTIN) 600 MG tablet Take 600 mg by mouth 3 (three) times daily.    Marland Kitchen losartan-hydrochlorothiazide (HYZAAR) 100-12.5 MG tablet Take 0.5 tablets by mouth daily.    . Omega-3 Fatty Acids (FISH OIL) 1000 MG CAPS Take 2,000 mg by mouth 2 (two) times daily.     . ondansetron (ZOFRAN) 8 MG tablet Take 8 mg by mouth every 8 (  eight) hours as needed.    . pantoprazole (PROTONIX) 40 MG tablet Take 40 mg by mouth daily.    Marland Kitchen PHOS-NAK 280-160-250 MG PACK Take 1 packet by mouth 2 (two) times daily.    . polyethylene glycol (MIRALAX / GLYCOLAX) packet Take 17 g by mouth daily.    . predniSONE (DELTASONE) 10 MG tablet Take 10 mg by mouth daily.    . tamsulosin (FLOMAX) 0.4 MG CAPS capsule Take 0.4 mg by mouth daily.    Marland Kitchen umeclidinium-vilanterol (ANORO ELLIPTA) 62.5-25 MCG/INH AEPB Inhale 1 puff into the lungs daily. (Patient not taking: Reported on 01/29/2021) 14 each 0  . valACYclovir (VALTREX) 1000 MG tablet Take 1,000 mg by mouth daily.     No current facility-administered medications for this visit.     Past Surgical History:  Procedure  Laterality Date  . APPENDECTOMY  1987  . CARDIAC CATHETERIZATION    . CATARACT EXTRACTION  11/2009 & 12/2015  . CORONARY STENT INTERVENTION  06/28/2018  . CORONARY STENT INTERVENTION N/A 06/28/2018   Procedure: CORONARY STENT INTERVENTION;  Surgeon: Nelva Bush, MD;  Location: [facility];  Service: Cardiovascular;  Laterality: N/A;  . GALLBLADDER SURGERY  1989  . INTRAVASCULAR ULTRASOUND/IVUS N/A 06/28/2018   Procedure: Intravascular Ultrasound/IVUS;  Surgeon: Nelva Bush, MD;  Location: [facility];  Service: Cardiovascular;  Laterality: N/A;  . NASAL SINUS SURGERY  11/21/1987  . RIGHT/LEFT HEART CATH AND CORONARY ANGIOGRAPHY N/A 06/28/2018   Procedure: RIGHT/LEFT HEART CATH AND CORONARY ANGIOGRAPHY;  Surgeon: Nelva Bush, MD;  Location: [facility];  Service: Cardiovascular;  Laterality: N/A;  . SHOULDER SURGERY  2007  . stent in lad  06/07/2004  . TONSILLECTOMY  1971  . trigerfinger  1990     Allergies  Allergen Reactions  . Eggs Or Egg-Derived Products   . Flagyl [Metronidazole]   . Penicillins Rash    Has patient had a PCN reaction causing immediate rash, facial/tongue/throat swelling, SOB or lightheadedness with hypotension: Yes Has patient had a PCN reaction causing severe rash involving mucus membranes or skin necrosis: No Has patient had a PCN reaction that required hospitalization: No Has patient had a PCN reaction occurring within the last 10 years: No If all of the above answers are "NO", then may proceed with Cephalosporin use.'      Family History  Problem Relation Age of Onset  . Heart attack Mother   . Heart attack Father   . Diabetes Father      Social History Mr. Heckmann reports that he has never smoked. He has never used smokeless tobacco. Mr. Ursua reports no history of alcohol use.   Review of Systems CONSTITUTIONAL: No weight loss, fever, chills, weakness or fatigue.  HEENT: Eyes: No visual loss, blurred vision,  double vision or yellow sclerae.No hearing loss, sneezing, congestion, runny nose or sore throat.  SKIN: No rash or itching.  CARDIOVASCULAR: per hpi RESPIRATORY: No shortness of breath, cough or sputum.  GASTROINTESTINAL: No anorexia, nausea, vomiting or diarrhea. No abdominal pain or blood.  GENITOURINARY: No burning on urination, no polyuria NEUROLOGICAL: No headache, dizziness, syncope, paralysis, ataxia, numbness or tingling in the extremities. No change in bowel or bladder control.  MUSCULOSKELETAL: No muscle, back pain, joint pain or stiffness.  LYMPHATICS: No enlarged nodes. No history of splenectomy.  PSYCHIATRIC: No history of depression or anxiety.  ENDOCRINOLOGIC: No reports of sweating, cold or heat intolerance. No polyuria or polydipsia.  Marland Kitchen   Physical Examination Today's  Vitals   02/21/21 0826  BP: (!) 146/70  Pulse: 92  SpO2: 98%  Weight: 181 lb 12.8 oz (82.5 kg)  Height: 5\' 6"  (1.676 m)   Body mass index is 29.34 kg/m.  Gen: resting comfortably, no acute distress HEENT: no scleral icterus, pupils equal round and reactive, no palptable cervical adenopathy,  CV: RRR, 3/6 systolic murmur , no jvd Resp: Clear to auscultation bilaterally GI: abdomen is soft, non-tender, non-distended, normal bowel sounds, no hepatosplenomegaly MSK: extremities are warm, no edema.  Skin: warm, no rash Neuro:  no focal deficits Psych: appropriate affect   Diagnostic Studies 02/2020 echo [facility] Summary  1. Technically difficult study due to chest wall/lung interference.  2. The left ventricle is normal in size with normal wall thickness.  3. The left ventricular systolic function is normal, LVEF is visually  estimated at 60-65%.  4. Visually there is mild aortic valve stenosis.  5. The left atrium is mildly dilated in size.  6. The right ventricle is normal in size, with normal systolic function.   06/2018 cath Conclusions: 1. Severe single-vessel coronary artery  involving proximal and distal edges/segments of previously placed proximal LAD stent. 2. Mild, non-obstructive CAD involving mid RCA. 3. Normal left heart, right heart, and pulmonary artery pressures. 4. Normal left ventricular systolic function and Fick cardiac output. 5. Successful IVUS-guided PCI to proximal and mid LAD with placement of Synergy 2.5 x 16 mm and Synergy 2.25 x 20 mm drug-eluting stents.  Both stents overlap the previously placed Taxus stent in the proximal LAD.  Final angiogram shows 0% residual stenosis with TIMI-3 flow.  Recommendations: 1. Aggressive secondary prevention. 2. Overnight extended recovery due to significant right arm/shoulder pain during and after catheterization secondary to chronic arthritis.     Assessment and Plan  1. CAD  - no recent symptoms - has been committed to indefinitie DAPT - continue current meds - EKG today shows SR, no ischemcic changes 2. HTN - elevated today, other provider visits has been at goal - monitor at this time, if persistently elevated room to titrate up his hyzaar  3. Hyperlipidemia - continue statin, troubles tolerating higher dose - request labs from pcp  4. Preoperative evaluation - considering hydrocelectomy - no active cardiac conditions, tolerates greater than 4 METs without symptoms - on indefinitie DAPT but 2.5 years out from stent ok to hold for procedure. Typically held 5 days prior and resumed day after. Contineu aspirin, it must be off for surgery hold 7 days prior and resume day after.  - recommend proceeding with surgyer.       Arnoldo Lenis, M.D.,

## 2021-02-27 NOTE — Telephone Encounter (Signed)
Patient called and discussed plavix hold approval per Dr. Harl Bowie for upcoming surgery with Dr. Alyson Ingles. Patient will hold plavix June 1st for June 6th surgery. Patient voiced understanding.

## 2021-03-14 NOTE — Patient Instructions (Signed)
Lonnie Duffy  03/14/2021     @PREFPERIOPPHARMACY @   Your procedure is scheduled on  03/18/2021.   Report to [facility] at  0830  A.M.   Call this number if you have problems the morning of surgery:  [phone]   Remember:  Do not eat or drink after midnight.                       Take these medicines the morning of surgery with A SIP OF WATER  Diflucan, gabapentin, gleevac, zofran (if needed), protonix, prednisone, flomax.  Use your inhalers before you come and bring your rescue inhaler with you.     Please brush your teeth.  Do not wear jewelry, make-up or nail polish.  Do not wear lotions, powders, or perfumes, or deodorant.  Do not shave 48 hours prior to surgery.  Men may shave face and neck.  Do not bring valuables to the hospital.  [facility] is not responsible for any belongings or valuables.  Contacts, dentures or bridgework may not be worn into surgery.  Leave your suitcase in the car.  After surgery it may be brought to your room.  For patients admitted to the hospital, discharge time will be determined by your treatment team.  Patients discharged the day of surgery will not be allowed to drive home and must have someone with them for 24 hours.    Special instructions:  DO NOT smoke tobacco or vape for 24 hours before your procedure.  Please read over the following fact sheets that you were given. Anesthesia Post-op Instructions and Care and Recovery After Surgery       Current Urology, 10(1), 1-14. [email or web address]">  Hydrocelectomy, Adult, Care After This sheet gives you information about how to care for yourself after your procedure. Your health care provider may also give you more specific instructions. If you have problems or questions, contact your health care provider. What can I expect after the procedure? After your procedure, it is common to have:  Mild discomfort and swelling in the pouch that holds your  testicles (scrotum).  Bruising of the scrotum. Follow these instructions at home: Medicines  Take over-the-counter and prescription medicines only as told by your health care provider.  Ask your health care provider if the medicine prescribed to you: ? Requires you to avoid driving or using heavy machinery. ? Can cause constipation. You may need to take these actions to prevent or treat constipation:  Drink enough fluid to keep your urine pale yellow.  Take over-the-counter or prescription medicines.  Eat foods that are high in fiber, such as beans, whole grains, and fresh fruits and vegetables.  Limit foods that are high in fat and processed sugars, such as fried or sweet foods. Bathing  Do not take baths, swim, or use a hot tub until your health care provider approves. Ask your health care provider if you may take showers. You may only be allowed to take sponge baths.  If you were told to wear an athletic support strap (scrotal support), keep it dry. Take it off when you shower or bathe. Incision care  Follow instructions from your health care provider about how to take care of your incision. Make sure you: ? Wash your hands with soap and water before and after you change your bandage (dressing). If soap and water are not available, use hand sanitizer. ? Change your dressing as told by  your health care provider. ? Leave stitches (sutures), skin glue, or adhesive strips in place. These skin closures may need to stay in place for 2 weeks or longer. If adhesive strip edges start to loosen and curl up, you may trim the loose edges. Do not remove adhesive strips completely unless your health care provider tells you to do that.  Check your incision and scrotum every day for signs of infection. Check for: ? More redness, swelling, or pain. ? Fluid or blood. ? Warmth. ? Pus or a bad smell.   Managing pain and swelling If directed, put ice on the affected area. To do this:  Put ice in  a plastic bag.  Place a towel between your skin and the bag.  Leave the ice on for 20 minutes, 2-3 times per day.   Activity  Do not do any high-energy activities for as long as told by your health care provider.  Do not lift anything that is heavier than 10 lb (4.5 kg), or the limit that you are told, until your health care provider says that it is safe.  Return to your normal activities as told by your health care provider. Ask your health care provider what activities are safe for you.  Do not drive for 24 hours if you were given a sedative during your procedure.  Ask your health care provider when it is safe to drive. General instructions  Do not use any products that contain nicotine or tobacco, such as cigarettes, e-cigarettes, and chewing tobacco. These can delay incision healing after surgery. If you need help quitting, ask your health care provider.  If you were given a scrotal support, wear it as told by your health care provider.  If you had a drain put in during the procedure, you will need to have it removed at a follow-up visit.  Keep all follow-up visits as told by your health care provider. This is important. Contact a health care provider if:  Your pain is not controlled with medicine.  You have more redness, swelling, or pain around your scrotum.  You have fluid or blood coming from your incision.  Your incision feels warm to the touch.  You have pus or a bad smell coming from your scrotum.  You have a fever. Get help right away if:  You develop shaking, chills, and a fever that is higher than 101.50F (38.8C).  You have redness or swelling that starts at your scrotum and spreads outward to cover your whole groin.  You develop swelling of the legs or difficulty breathing. Summary  After a hydrocelectomy, it is common to have mild discomfort, swelling, and bruising.  Do not take baths, swim, or use a hot tub until your health care provider approves.  Ask your health care provider if you may take showers.  If directed, put ice on the affected area to help with pain and swelling.  Do not do any high-energy activities or lift anything heavier than 10 lb (4.5 kg) for as long as told by your health care provider.  If you were given a scrotal support, keep it dry. Wear the scrotal support as told by your health care provider. This information is not intended to replace advice given to you by your health care provider. Make sure you discuss any questions you have with your health care provider. Document Revised: 02/22/2019 Document Reviewed: 02/22/2019 Elsevier Patient Education  2021 [place] Anesthesia, Adult, Care After This sheet gives you information  about how to care for yourself after your procedure. Your health care provider may also give you more specific instructions. If you have problems or questions, contact your health care provider. What can I expect after the procedure? After the procedure, the following side effects are common:  Pain or discomfort at the IV site.  Nausea.  Vomiting.  Sore throat.  Trouble concentrating.  Feeling cold or chills.  Feeling weak or tired.  Sleepiness and fatigue.  Soreness and body aches. These side effects can affect parts of the body that were not involved in surgery. Follow these instructions at home: For the time period you were told by your health care provider:  Rest.  Do not participate in activities where you could fall or become injured.  Do not drive or use machinery.  Do not drink alcohol.  Do not take sleeping pills or medicines that cause drowsiness.  Do not make important decisions or sign legal documents.  Do not take care of children on your own.   Eating and drinking  Follow any instructions from your health care provider about eating or drinking restrictions.  When you feel hungry, start by eating small amounts of foods that are soft and  easy to digest (bland), such as toast. Gradually return to your regular diet.  Drink enough fluid to keep your urine pale yellow.  If you vomit, rehydrate by drinking water, juice, or clear broth. General instructions  If you have sleep apnea, surgery and certain medicines can increase your risk for breathing problems. Follow instructions from your health care provider about wearing your sleep device: ? Anytime you are sleeping, including during daytime naps. ? While taking prescription pain medicines, sleeping medicines, or medicines that make you drowsy.  Have a responsible adult stay with you for the time you are told. It is important to have someone help care for you until you are awake and alert.  Return to your normal activities as told by your health care provider. Ask your health care provider what activities are safe for you.  Take over-the-counter and prescription medicines only as told by your health care provider.  If you smoke, do not smoke without supervision.  Keep all follow-up visits as told by your health care provider. This is important. Contact a health care provider if:  You have nausea or vomiting that does not get better with medicine.  You cannot eat or drink without vomiting.  You have pain that does not get better with medicine.  You are unable to pass urine.  You develop a skin rash.  You have a fever.  You have redness around your IV site that gets worse. Get help right away if:  You have difficulty breathing.  You have chest pain.  You have blood in your urine or stool, or you vomit blood. Summary  After the procedure, it is common to have a sore throat or nausea. It is also common to feel tired.  Have a responsible adult stay with you for the time you are told. It is important to have someone help care for you until you are awake and alert.  When you feel hungry, start by eating small amounts of foods that are soft and easy to digest  (bland), such as toast. Gradually return to your regular diet.  Drink enough fluid to keep your urine pale yellow.  Return to your normal activities as told by your health care provider. Ask your health care provider what activities are safe  for you. This information is not intended to replace advice given to you by your health care provider. Make sure you discuss any questions you have with your health care provider. Document Revised: 06/14/2020 Document Reviewed: 01/12/2020 Elsevier Patient Education  2021 [place]. How to Use Chlorhexidine for Bathing Chlorhexidine gluconate (CHG) is a germ-killing (antiseptic) solution that is used to clean the skin. It can get rid of the bacteria that normally live on the skin and can keep them away for about 24 hours. To clean your skin with CHG, you may be given:  A CHG solution to use in the shower or as part of a sponge bath.  A prepackaged cloth that contains CHG. Cleaning your skin with CHG may help lower the risk for infection:  While you are staying in the intensive care unit of the hospital.  If you have a vascular access, such as a central line, to provide short-term or long-term access to your veins.  If you have a catheter to drain urine from your bladder.  If you are on a ventilator. A ventilator is a machine that helps you breathe by moving air in and out of your lungs.  After surgery. What are the risks? Risks of using CHG include:  A skin reaction.  Hearing loss, if CHG gets in your ears.  Eye injury, if CHG gets in your eyes and is not rinsed out.  The CHG product catching fire. Make sure that you avoid smoking and flames after applying CHG to your skin. Do not use CHG:  If you have a chlorhexidine allergy or have previously reacted to chlorhexidine.  On babies younger than 42 months of age. How to use CHG solution  Use CHG only as told by your health care provider, and follow the instructions on the label.  Use  the full amount of CHG as directed. Usually, this is one bottle. During a shower Follow these steps when using CHG solution during a shower (unless your health care provider gives you different instructions): 1. Start the shower. 2. Use your normal soap and shampoo to wash your face and hair. 3. Turn off the shower or move out of the shower stream. 4. Pour the CHG onto a clean washcloth. Do not use any type of brush or rough-edged sponge. 5. Starting at your neck, lather your body down to your toes. Make sure you follow these instructions: ? If you will be having surgery, pay special attention to the part of your body where you will be having surgery. Scrub this area for at least 1 minute. ? Do not use CHG on your head or face. If the solution gets into your ears or eyes, rinse them well with water. ? Avoid your genital area. ? Avoid any areas of skin that have broken skin, cuts, or scrapes. ? Scrub your back and under your arms. Make sure to wash skin folds. 6. Let the lather sit on your skin for 1-2 minutes or as long as told by your health care provider. 7. Thoroughly rinse your entire body in the shower. Make sure that all body creases and crevices are rinsed well. 8. Dry off with a clean towel. Do not put any substances on your body afterward--such as powder, lotion, or perfume--unless you are told to do so by your health care provider. Only use lotions that are recommended by the manufacturer. 9. Put on clean clothes or pajamas. 10. If it is the night before your surgery, sleep in clean  sheets.   During a sponge bath Follow these steps when using CHG solution during a sponge bath (unless your health care provider gives you different instructions): 1. Use your normal soap and shampoo to wash your face and hair. 2. Pour the CHG onto a clean washcloth. 3. Starting at your neck, lather your body down to your toes. Make sure you follow these instructions: ? If you will be having surgery, pay  special attention to the part of your body where you will be having surgery. Scrub this area for at least 1 minute. ? Do not use CHG on your head or face. If the solution gets into your ears or eyes, rinse them well with water. ? Avoid your genital area. ? Avoid any areas of skin that have broken skin, cuts, or scrapes. ? Scrub your back and under your arms. Make sure to wash skin folds. 4. Let the lather sit on your skin for 1-2 minutes or as long as told by your health care provider. 5. Using a different clean, wet washcloth, thoroughly rinse your entire body. Make sure that all body creases and crevices are rinsed well. 6. Dry off with a clean towel. Do not put any substances on your body afterward--such as powder, lotion, or perfume--unless you are told to do so by your health care provider. Only use lotions that are recommended by the manufacturer. 7. Put on clean clothes or pajamas. 8. If it is the night before your surgery, sleep in clean sheets. How to use CHG prepackaged cloths  Only use CHG cloths as told by your health care provider, and follow the instructions on the label.  Use the CHG cloth on clean, dry skin.  Do not use the CHG cloth on your head or face unless your health care provider tells you to.  When washing with the CHG cloth: ? Avoid your genital area. ? Avoid any areas of skin that have broken skin, cuts, or scrapes. Before surgery Follow these steps when using a CHG cloth to clean before surgery (unless your health care provider gives you different instructions): 1. Using the CHG cloth, vigorously scrub the part of your body where you will be having surgery. Scrub using a back-and-forth motion for 3 minutes. The area on your body should be completely wet with CHG when you are done scrubbing. 2. Do not rinse. Discard the cloth and let the area air-dry. Do not put any substances on the area afterward, such as powder, lotion, or perfume. 3. Put on clean clothes or  pajamas. 4. If it is the night before your surgery, sleep in clean sheets.   For general bathing Follow these steps when using CHG cloths for general bathing (unless your health care provider gives you different instructions). 1. Use a separate CHG cloth for each area of your body. Make sure you wash between any folds of skin and between your fingers and toes. Wash your body in the following order, switching to a new cloth after each step: ? The front of your neck, shoulders, and chest. ? Both of your arms, under your arms, and your hands. ? Your stomach and groin area, avoiding the genitals. ? Your right leg and foot. ? Your left leg and foot. ? The back of your neck, your back, and your buttocks. 2. Do not rinse. Discard the cloth and let the area air-dry. Do not put any substances on your body afterward--such as powder, lotion, or perfume--unless you are told to do so  by your health care provider. Only use lotions that are recommended by the manufacturer. 3. Put on clean clothes or pajamas. Contact a health care provider if:  Your skin gets irritated after scrubbing.  You have questions about using your solution or cloth. Get help right away if:  Your eyes become very red or swollen.  Your eyes itch badly.  Your skin itches badly and is red or swollen.  Your hearing changes.  You have trouble seeing.  You have swelling or tingling in your mouth or throat.  You have trouble breathing.  You swallow any chlorhexidine. Summary  Chlorhexidine gluconate (CHG) is a germ-killing (antiseptic) solution that is used to clean the skin. Cleaning your skin with CHG may help to lower your risk for infection.  You may be given CHG to use for bathing. It may be in a bottle or in a prepackaged cloth to use on your skin. Carefully follow your health care provider's instructions and the instructions on the product label.  Do not use CHG if you have a chlorhexidine allergy.  Contact your  health care provider if your skin gets irritated after scrubbing. This information is not intended to replace advice given to you by your health care provider. Make sure you discuss any questions you have with your health care provider. Document Revised: 03/16/2020 Document Reviewed: 03/16/2020 Elsevier Patient Education  [place].

## 2021-03-15 ENCOUNTER — Encounter (HOSPITAL_COMMUNITY)
Admission: RE | Admit: 2021-03-15 | Discharge: 2021-03-15 | Disposition: A | Discharge status: Home / Self Care | Payer: Medicare Other | Source: Ambulatory Visit | Attending: Urology | Admitting: Urology

## 2021-03-15 ENCOUNTER — Encounter (HOSPITAL_COMMUNITY): Payer: Self-pay

## 2021-03-15 ENCOUNTER — Other Ambulatory Visit: Payer: Self-pay

## 2021-03-15 DIAGNOSIS — Z01812 Encounter for preprocedural laboratory examination: Secondary | ICD-10-CM | POA: Diagnosis not present

## 2021-03-15 HISTORY — DX: Chronic obstructive pulmonary disease, unspecified: J44.9

## 2021-03-15 HISTORY — DX: Leukemia, unspecified not having achieved remission: C95.90

## 2021-03-15 LAB — BASIC METABOLIC PANEL
Anion gap: 9 (ref 5–15)
BUN: 16 mg/dL (ref 8–23)
CO2: 26 mmol/L (ref 22–32)
Calcium: 9 mg/dL (ref 8.9–10.3)
Chloride: 94 mmol/L — ABNORMAL LOW (ref 98–111)
Creatinine, Ser: 1.19 mg/dL (ref 0.61–1.24)
GFR, Estimated: >60 mL/min (ref >60)
Glucose, Bld: 192 mg/dL — ABNORMAL HIGH (ref 70–99)
Potassium: 4.1 mmol/L (ref 3.5–5.1)
Sodium: 129 mmol/L — ABNORMAL LOW (ref 135–145)

## 2021-03-18 ENCOUNTER — Encounter (HOSPITAL_COMMUNITY): Payer: Self-pay | Admitting: Urology

## 2021-03-18 ENCOUNTER — Encounter (HOSPITAL_COMMUNITY)
Admission: RE | Disposition: A | Discharge status: Home / Self Care | Payer: Self-pay | Source: Home / Self Care | Attending: Urology

## 2021-03-18 ENCOUNTER — Ambulatory Visit (HOSPITAL_COMMUNITY)
Admission: RE | Admit: 2021-03-18 | Discharge: 2021-03-18 | Disposition: A | Discharge status: Home / Self Care | Payer: Medicare Other | Attending: Urology | Admitting: Urology

## 2021-03-18 ENCOUNTER — Ambulatory Visit (HOSPITAL_COMMUNITY): Payer: Medicare Other | Admitting: Anesthesiology

## 2021-03-18 DIAGNOSIS — Z7982 Long term (current) use of aspirin: Secondary | ICD-10-CM | POA: Diagnosis not present

## 2021-03-18 DIAGNOSIS — Z880 Allergy status to penicillin: Secondary | ICD-10-CM | POA: Insufficient documentation

## 2021-03-18 DIAGNOSIS — E119 Type 2 diabetes mellitus without complications: Secondary | ICD-10-CM | POA: Insufficient documentation

## 2021-03-18 DIAGNOSIS — Z8249 Family history of ischemic heart disease and other diseases of the circulatory system: Secondary | ICD-10-CM | POA: Diagnosis not present

## 2021-03-18 DIAGNOSIS — I2510 Atherosclerotic heart disease of native coronary artery without angina pectoris: Secondary | ICD-10-CM | POA: Insufficient documentation

## 2021-03-18 DIAGNOSIS — Z79899 Other long term (current) drug therapy: Secondary | ICD-10-CM | POA: Diagnosis not present

## 2021-03-18 DIAGNOSIS — N433 Hydrocele, unspecified: Secondary | ICD-10-CM

## 2021-03-18 DIAGNOSIS — I10 Essential (primary) hypertension: Secondary | ICD-10-CM | POA: Diagnosis not present

## 2021-03-18 DIAGNOSIS — Z881 Allergy status to other antibiotic agents status: Secondary | ICD-10-CM | POA: Insufficient documentation

## 2021-03-18 DIAGNOSIS — Z9049 Acquired absence of other specified parts of digestive tract: Secondary | ICD-10-CM | POA: Diagnosis not present

## 2021-03-18 DIAGNOSIS — Z833 Family history of diabetes mellitus: Secondary | ICD-10-CM | POA: Insufficient documentation

## 2021-03-18 DIAGNOSIS — Z955 Presence of coronary angioplasty implant and graft: Secondary | ICD-10-CM | POA: Diagnosis not present

## 2021-03-18 HISTORY — PX: HYDROCELE EXCISION: SHX482

## 2021-03-18 LAB — GLUCOSE, CAPILLARY
Glucose-Capillary: 127 mg/dL — ABNORMAL HIGH (ref 70–99)
Glucose-Capillary: 130 mg/dL — ABNORMAL HIGH (ref 70–99)

## 2021-03-18 SURGERY — HYDROCELECTOMY
Anesthesia: General | Site: Scrotum | Laterality: Right

## 2021-03-18 MED ORDER — ONDANSETRON HCL 4 MG/2ML IJ SOLN
INTRAMUSCULAR | Status: AC
  Filled 2021-03-18: qty 2

## 2021-03-18 MED ORDER — BUPIVACAINE HCL (PF) 0.25 % IJ SOLN
INTRAMUSCULAR | Status: DC | PRN
  Administered 2021-03-18: 10 mL

## 2021-03-18 MED ORDER — ONDANSETRON HCL 4 MG/2ML IJ SOLN
4.0000 mg | Freq: Once | INTRAMUSCULAR | Status: AC | PRN
  Administered 2021-03-18: 4 mg via INTRAVENOUS
  Filled 2021-03-18: qty 2

## 2021-03-18 MED ORDER — LIDOCAINE 2% (20 MG/ML) 5 ML SYRINGE
INTRAMUSCULAR | Status: DC | PRN
  Administered 2021-03-18: 60 mg via INTRAVENOUS

## 2021-03-18 MED ORDER — LIDOCAINE HCL (PF) 2 % IJ SOLN
INTRAMUSCULAR | Status: AC
  Filled 2021-03-18: qty 5

## 2021-03-18 MED ORDER — PROPOFOL 10 MG/ML IV BOLUS
INTRAVENOUS | Status: AC
  Filled 2021-03-18: qty 20

## 2021-03-18 MED ORDER — PHENYLEPHRINE 40 MCG/ML (10ML) SYRINGE FOR IV PUSH (FOR BLOOD PRESSURE SUPPORT)
PREFILLED_SYRINGE | INTRAVENOUS | Status: AC
  Filled 2021-03-18: qty 10

## 2021-03-18 MED ORDER — FENTANYL CITRATE (PF) 100 MCG/2ML IJ SOLN
INTRAMUSCULAR | Status: DC | PRN
  Administered 2021-03-18 (×4): 25 ug via INTRAVENOUS

## 2021-03-18 MED ORDER — FENTANYL CITRATE (PF) 100 MCG/2ML IJ SOLN
INTRAMUSCULAR | Status: AC
  Filled 2021-03-18: qty 2

## 2021-03-18 MED ORDER — CEFAZOLIN SODIUM-DEXTROSE 2-4 GM/100ML-% IV SOLN
2.0000 g | INTRAVENOUS | Status: AC
  Administered 2021-03-18: 2 g via INTRAVENOUS
  Filled 2021-03-18: qty 100

## 2021-03-18 MED ORDER — CHLORHEXIDINE GLUCONATE 0.12 % MT SOLN
15.0000 mL | Freq: Once | OROMUCOSAL | Status: AC
  Administered 2021-03-18: 15 mL via OROMUCOSAL
  Filled 2021-03-18: qty 15

## 2021-03-18 MED ORDER — KETOROLAC TROMETHAMINE 30 MG/ML IJ SOLN
INTRAMUSCULAR | Status: DC | PRN
  Administered 2021-03-18: 30 mg via INTRAVENOUS

## 2021-03-18 MED ORDER — GLYCOPYRROLATE PF 0.2 MG/ML IJ SOSY
PREFILLED_SYRINGE | INTRAMUSCULAR | Status: DC | PRN
  Administered 2021-03-18: .1 mg via INTRAVENOUS

## 2021-03-18 MED ORDER — DEXAMETHASONE SODIUM PHOSPHATE 10 MG/ML IJ SOLN
INTRAMUSCULAR | Status: AC
  Filled 2021-03-18: qty 1

## 2021-03-18 MED ORDER — DEXAMETHASONE SODIUM PHOSPHATE 4 MG/ML IJ SOLN
INTRAMUSCULAR | Status: DC | PRN
  Administered 2021-03-18: 4 mg via INTRAVENOUS

## 2021-03-18 MED ORDER — ORAL CARE MOUTH RINSE: 15.0000 mL | Freq: Once | OROMUCOSAL | Status: AC

## 2021-03-18 MED ORDER — BUPIVACAINE HCL (PF) 0.25 % IJ SOLN
INTRAMUSCULAR | Status: AC
  Filled 2021-03-18: qty 30

## 2021-03-18 MED ORDER — KETOROLAC TROMETHAMINE 30 MG/ML IJ SOLN
INTRAMUSCULAR | Status: AC
  Filled 2021-03-18: qty 1

## 2021-03-18 MED ORDER — 0.9 % SODIUM CHLORIDE (POUR BTL) OPTIME
TOPICAL | Status: DC | PRN
  Administered 2021-03-18: 1000 mL

## 2021-03-18 MED ORDER — LACTATED RINGERS IV SOLN: INTRAVENOUS | Status: DC

## 2021-03-18 MED ORDER — FENTANYL CITRATE (PF) 100 MCG/2ML IJ SOLN: 25.0000 ug | INTRAMUSCULAR | Status: DC | PRN

## 2021-03-18 MED ORDER — ONDANSETRON HCL 4 MG/2ML IJ SOLN
INTRAMUSCULAR | Status: DC | PRN
  Administered 2021-03-18: 4 mg via INTRAVENOUS

## 2021-03-18 MED ORDER — OXYCODONE-ACETAMINOPHEN 5-325 MG PO TABS: 1.0000 | ORAL_TABLET | ORAL | 0 refills | Status: DC | PRN

## 2021-03-18 MED ORDER — PHENYLEPHRINE 40 MCG/ML (10ML) SYRINGE FOR IV PUSH (FOR BLOOD PRESSURE SUPPORT)
PREFILLED_SYRINGE | INTRAVENOUS | Status: DC | PRN
  Administered 2021-03-18: 80 ug via INTRAVENOUS

## 2021-03-18 MED ORDER — PROPOFOL 10 MG/ML IV BOLUS
INTRAVENOUS | Status: DC | PRN
  Administered 2021-03-18: 130 mg via INTRAVENOUS

## 2021-03-18 MED ORDER — GLYCOPYRROLATE PF 0.2 MG/ML IJ SOSY
PREFILLED_SYRINGE | INTRAMUSCULAR | Status: AC
  Filled 2021-03-18: qty 1

## 2021-03-18 MED ORDER — CHLORHEXIDINE GLUCONATE 4 % EX LIQD: CUTANEOUS | Status: DC

## 2021-03-18 SURGICAL SUPPLY — 31 items
ADH SKN CLS APL DERMABOND .7 (GAUZE/BANDAGES/DRESSINGS) ×1
BLADE SURG 15 STRL LF DISP TIS (BLADE) ×1 IMPLANT
BLADE SURG 15 STRL SS (BLADE) ×2
COVER LIGHT HANDLE STERIS (MISCELLANEOUS) ×4 IMPLANT
COVER WAND RF STERILE (DRAPES) ×2 IMPLANT
DECANTER SPIKE VIAL GLASS SM (MISCELLANEOUS) ×2 IMPLANT
DERMABOND ADVANCED (GAUZE/BANDAGES/DRESSINGS) ×1
DERMABOND ADVANCED .7 DNX12 (GAUZE/BANDAGES/DRESSINGS) ×1 IMPLANT
ELECT REM PT RETURN 9FT ADLT (ELECTROSURGICAL) ×2
ELECTRODE REM PT RTRN 9FT ADLT (ELECTROSURGICAL) ×1 IMPLANT
GAUZE SPONGE 4X4 12PLY STRL (GAUZE/BANDAGES/DRESSINGS) ×2 IMPLANT
GLOVE BIO SURGEON STRL SZ8 (GLOVE) ×2 IMPLANT
GLOVE SRG 8 PF TXTR STRL LF DI (GLOVE) ×1 IMPLANT
GLOVE SURG UNDER POLY LF SZ7 (GLOVE) ×2 IMPLANT
GLOVE SURG UNDER POLY LF SZ8 (GLOVE) ×2
GOWN STRL REUS W/TWL LRG LVL3 (GOWN DISPOSABLE) ×2 IMPLANT
GOWN STRL REUS W/TWL XL LVL3 (GOWN DISPOSABLE) ×2 IMPLANT
KIT TURNOVER KIT A (KITS) ×2 IMPLANT
MANIFOLD NEPTUNE II (INSTRUMENTS) ×2 IMPLANT
NEEDLE HYPO 25X1 1.5 SAFETY (NEEDLE) ×2 IMPLANT
NS IRRIG 1000ML POUR BTL (IV SOLUTION) ×2 IMPLANT
PACK MINOR (CUSTOM PROCEDURE TRAY) ×2 IMPLANT
PAD ARMBOARD 7.5X6 YLW CONV (MISCELLANEOUS) ×2 IMPLANT
PENCIL SMOKE EVACUATOR (MISCELLANEOUS) ×2 IMPLANT
SET BASIN LINEN APH (SET/KITS/TRAYS/PACK) ×2 IMPLANT
SUPPORT SCROTAL LG STRP (MISCELLANEOUS) ×2 IMPLANT
SUT MNCRL AB 4-0 PS2 18 (SUTURE) ×2 IMPLANT
SUT VIC AB 3-0 SH 27 (SUTURE) ×2
SUT VIC AB 3-0 SH 27X BRD (SUTURE) ×1 IMPLANT
SYR CONTROL 10ML LL (SYRINGE) ×2 IMPLANT
YANKAUER SUCT 12FT TUBE ARGYLE (SUCTIONS) ×2 IMPLANT

## 2021-03-18 NOTE — H&P (Signed)
Right scrotal swelling  HPI: Lonnie Duffy is a 80yo here for evaluation of right scrotal swelling. He noticed the swelling started 5 months after a fall down stairs. He has pain with ambulation related to the right scrotal swelling. He mas mild LUTS.  PMH:     Past Medical History:  Diagnosis Date  . ASCVD (arteriosclerotic cardiovascular disease)   . Chest pain   . Collagen vascular disease ([place])   . Complication of anesthesia    "I had local anesthesia , and I went out for 3 hrs "  . 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 Coronary artery disease   . Diabetes mellitus without complication ([place])   . Dyspnea   . GERD (gastroesophageal reflux disease)   . Hypertension   . Palpitations     Surgical History:      Past Surgical History:  Procedure Laterality Date  . APPENDECTOMY  1987  . CARDIAC CATHETERIZATION    . CATARACT EXTRACTION  11/2009 & 12/2015  . CORONARY STENT INTERVENTION  06/28/2018  . CORONARY STENT INTERVENTION N/A 06/28/2018   Procedure: CORONARY STENT INTERVENTION;  Surgeon: Nelva Bush, MD;  Location: [facility];  Service: Cardiovascular;  Laterality: N/A;  . GALLBLADDER SURGERY  1989  . INTRAVASCULAR ULTRASOUND/IVUS N/A 06/28/2018   Procedure: Intravascular Ultrasound/IVUS;  Surgeon: Nelva Bush, MD;  Location: [facility];  Service: Cardiovascular;  Laterality: N/A;  . NASAL SINUS SURGERY  11/21/1987  . RIGHT/LEFT HEART CATH AND CORONARY ANGIOGRAPHY N/A 06/28/2018   Procedure: RIGHT/LEFT HEART CATH AND CORONARY ANGIOGRAPHY;  Surgeon: Nelva Bush, MD;  Location: [facility];  Service: Cardiovascular;  Laterality: N/A;  . SHOULDER SURGERY  2007  . stent in lad  06/07/2004  . TONSILLECTOMY  1971  . trigerfinger  1990    Home Medications:       Allergies as of 01/29/2021      Reactions   Eggs Or Egg-derived Products    Flagyl [metronidazole]    Penicillins Rash   Has patient had a PCN  reaction causing immediate rash, facial/tongue/throat swelling, SOB or lightheadedness with hypotension: Yes Has patient had a PCN reaction causing severe rash involving mucus membranes or skin necrosis: No Has patient had a PCN reaction that required hospitalization: No Has patient had a PCN reaction occurring within the last 10 years: No If all of the above answers are "NO", then may proceed with Cephalosporin use.'         Medication List       Accurate as of January 29, 2021  3:37 PM. If you have any questions, ask your nurse or doctor.        Anoro Ellipta 62.5-25 MCG/INH Aepb Generic drug: umeclidinium-vilanterol Inhale 1 puff into the lungs daily.   aspirin EC 81 MG tablet Take 162 mg by mouth daily.   atorvastatin 10 MG tablet Commonly known as: LIPITOR Take 10 mg by mouth at bedtime.   CALCIUM 500 PO Take 1 tablet by mouth 2 (two) times daily.   clopidogrel 75 MG tablet Commonly known as: PLAVIX TAKE 1 TABLET BY MOUTH EVERY DAY   cyclobenzaprine 10 MG tablet Commonly known as: FLEXERIL Take 10 mg by mouth at bedtime as needed for muscle spasms.   Fish Oil 1000 MG Caps Take 2,000 mg by mouth 2 (two) times daily.   fluconazole 150 MG tablet Commonly known as: DIFLUCAN Take 150 mg by mouth daily.   fluticasone 50 MCG/ACT nasal spray Commonly known  as: FLONASE Place 2 sprays into both nostrils daily.   folic acid 1 MG tablet Commonly known as: FOLVITE Take 1 mg by mouth daily.   gabapentin 600 MG tablet Commonly known as: NEURONTIN Take 600 mg by mouth 3 (three) times daily.   losartan-hydrochlorothiazide 100-12.5 MG tablet Commonly known as: HYZAAR Take 0.5 tablets by mouth daily.   ondansetron 8 MG tablet Commonly known as: ZOFRAN Take 8 mg by mouth every 8 (eight) hours as needed.   pantoprazole 40 MG tablet Commonly known as: PROTONIX Take 40 mg by mouth daily.   Phos-NaK 280-160-250 MG Pack Generic drug: potassium &  sodium phosphates Take 1 packet by mouth 2 (two) times daily.   polyethylene glycol 17 g packet Commonly known as: MIRALAX / GLYCOLAX Take 17 g by mouth daily.   predniSONE 10 MG tablet Commonly known as: DELTASONE Take 10 mg by mouth daily.   tamsulosin 0.4 MG Caps capsule Commonly known as: FLOMAX Take 0.4 mg by mouth daily.   valACYclovir 1000 MG tablet Commonly known as: VALTREX Take 1,000 mg by mouth daily.   Vitamin D3 125 MCG (5000 UT) Tabs Take 5,000 Units by mouth daily.       Allergies:       Allergies  Allergen Reactions  . Eggs Or Egg-Derived Products   . Flagyl [Metronidazole]   . Penicillins Rash    Has patient had a PCN reaction causing immediate rash, facial/tongue/throat swelling, SOB or lightheadedness with hypotension: Yes Has patient had a PCN reaction causing severe rash involving mucus membranes or skin necrosis: No Has patient had a PCN reaction that required hospitalization: No Has patient had a PCN reaction occurring within the last 10 years: No If all of the above answers are "NO", then may proceed with Cephalosporin use.'    Family History:      Family History  Problem Relation Age of Onset  . Heart attack Mother   . Heart attack Father   . Diabetes Father     Social History:  reports that he has never smoked. He has never used smokeless tobacco. He reports that he does not drink alcohol and does not use drugs.  ROS: All other review of systems were reviewed and are negative except what is noted above in HPI  Physical Exam: BP 134/64   Pulse (!) 113   Temp 98.2 F (36.8 C)   Ht 5\' 7"  (1.702 m)   Wt 180 lb (81.6 kg)   BMI 28.19 kg/m   Constitutional:  Alert and oriented, No acute distress. HEENT: [place] AT, moist mucus membranes.  Trachea midline, no masses. Cardiovascular: No clubbing, cyanosis, or edema. Respiratory: Normal respiratory effort, no increased work of breathing. GI: Abdomen is soft, nontender,  nondistended, no abdominal masses GU: No CVA tenderness. Uncircumcised phallus. No masses/lesions on penis, testis, scrotum. 8cm right hydrocele Lymph: No cervical or inguinal lymphadenopathy. Skin: No rashes, bruises or suspicious lesions. Neurologic: Grossly intact, no focal deficits, moving all 4 extremities. Psychiatric: Normal mood and affect.  Laboratory Data: Recent Labs       Lab Results  Component Value Date   WBC 7.4 06/29/2018   HGB 11.6 (L) 06/29/2018   HCT 34.9 (L) 06/29/2018   MCV 92.1 06/29/2018   PLT 237 06/29/2018      Recent Labs       Lab Results  Component Value Date   CREATININE 1.09 06/29/2018      Recent Labs  No results found for: PSA  Recent Labs  No results found for: TESTOSTERONE    Recent Labs  No results found for: HGBA1C    Urinalysis Labs (Brief)  No results found for: COLORURINE, APPEARANCEUR, LABSPEC, [place], GLUCOSEU, HGBUR, BILIRUBINUR, KETONESUR, PROTEINUR, UROBILINOGEN, NITRITE, LEUKOCYTESUR    Recent Labs  No results found for: LABMICR, [place], RBCUA, LABEPIT, MUCUS, BACTERIA    Pertinent Imaging:  No results found for this or any previous visit.  No results found for this or any previous visit.  No results found for this or any previous visit.  No results found for this or any previous visit.  No results found for this or any previous visit.  No results found for this or any previous visit.  No results found for this or any previous visit.  No results found for this or any previous visit.   Assessment & Plan:    1. Hydrocele, unspecified hydrocele type -We discussed observation, aspiration and hydrocelectomy and after discussing the options the patient elects for right hydrocelectomy. Risks/benefits/alternatives discussed.  - Urinalysis, Routine w reflex microscopic

## 2021-03-18 NOTE — Anesthesia Postprocedure Evaluation (Signed)
Anesthesia Post Note  Patient: Lonnie Duffy  Procedure(s) Performed: HYDROCELECTOMY ADULT (Right Scrotum)  Patient location during evaluation: PACU Anesthesia Type: General Level of consciousness: awake and alert and oriented Pain management: pain level controlled Vital Signs Assessment: post-procedure vital signs reviewed and stable Respiratory status: spontaneous breathing and respiratory function stable Cardiovascular status: blood pressure returned to baseline and stable Postop Assessment: no apparent nausea or vomiting Anesthetic complications: no   No complications documented.   Last Vitals:  Vitals:   03/18/21 1100 03/18/21 1111  BP: (!) 143/74   Pulse: 82 87  Resp: 19   Temp:  36.7 C  SpO2: 99%     Last Pain:  Vitals:   03/18/21 1111  TempSrc: Oral  PainSc: 0-No pain                 Rethel Sebek C Tationa Stech

## 2021-03-18 NOTE — Transfer of Care (Signed)
Immediate Anesthesia Transfer of Care Note  Patient: [name]  Procedure(s) Performed: HYDROCELECTOMY ADULT (Right Scrotum)  Patient Location: PACU  Anesthesia Type:General  Level of Consciousness: awake, alert  and oriented  Airway & Oxygen Therapy: Patient Spontanous Breathing and Patient connected to face mask oxygen  Post-op Assessment: Report given to RN and Post -op Vital signs reviewed and stable  Post vital signs: Reviewed and stable  Last Vitals:  Vitals Value Taken Time  BP    Temp    Pulse 95 03/18/21 0954  Resp 20 03/18/21 0954  SpO2 100 % 03/18/21 0954  Vitals shown include unvalidated device data.  Last Pain:  Vitals:   03/18/21 0900  TempSrc: Oral  PainSc: 0-No pain      Patients Stated Pain Goal: 5 (24/81/85 9093)  Complications: No complications documented.

## 2021-03-18 NOTE — Op Note (Signed)
Preoperative diagnosis: Right Hydrocele  Postoperative diagnosis: Same  Procedure: 1. Excision of right appendix testis 2. Right hydrocelectomy  Attending: Nicolette Bang, MD  Anesthesia: General  History of blood loss: Minimal  Antibiotics: ancef  Drains: none  Specimens: 1. Right hydrocele sac   Findings: 8cm hydrocele  Indications: Patient is a 80 year old male with a history of right hydrocele that was growing in size and causing him pain with walking.  We discussed the treatment options including observation versus excision after discussing treatment options he proceed with excision.   Procedure in detail: Prior to procedure consent was obtained.  Patient was brought to the operating room and a brief timeout was done to ensure correct patient, correct procedure, correct site.  General anesthesia was administered and patient was placed in supine position.  His genitalia was then prepped and draped in usual sterile fashion.  A 3 cm incision was made in the right hemiscrotum.  We dissected down to the tunica and then incised the tunica. A large hydrocele was encountered and was drained. We then excised the hydrocele sac and then over sewed the edge with 2-0 Vicryl in a running fashion. We then excised the right appendix testis. Hemostasis was then obtained with electrocautery. We then closed the defect in the epididymis with 3-0 vicryl in a running fashion. We then returned the testis to the left hemiscrotum and closed the overlying dartos with 3-0 vicryl in a running fashion. The skin was then closed with 4-0 monocryl in a running fashion. Dermabond was placed on the incision.  A dressing was then applied to the incision.  We then placed a scrotal fluff and this then concluded the procedure which was well tolerated by the patient.  Complications: None  Condition: Stable, extubated, transferred to PACU.  Plan: Patient is to be discharged home.  He is to follow up in 2 weeks for  wound check.

## 2021-03-18 NOTE — Anesthesia Procedure Notes (Signed)
Procedure Name: LMA Insertion Date/Time: 03/18/2021 9:13 AM Performed by: Orlie Dakin, CRNA Pre-anesthesia Checklist: Patient identified, Emergency Drugs available, Suction available and Patient being monitored Patient Re-evaluated:Patient Re-evaluated prior to induction Oxygen Delivery Method: Circle system utilized Preoxygenation: Pre-oxygenation with 100% oxygen Induction Type: IV induction LMA: LMA inserted LMA Size: 4.0 Tube type: Oral Number of attempts: 1 Placement Confirmation: positive ETCO2 Tube secured with: Tape Dental Injury: Teeth and Oropharynx as per pre-operative assessment

## 2021-03-18 NOTE — Anesthesia Preprocedure Evaluation (Addendum)
Anesthesia Evaluation  Patient identified by MRN, date of birth, ID band Patient awake    Reviewed: Allergy & Precautions, NPO status , Patient's Chart, lab work & pertinent test results  History of Anesthesia Complications (+) history of anesthetic complications  Airway Mallampati: II  TM Distance: >3 FB Neck ROM: Full    Dental  (+) Dental Advisory Given, Upper Dentures, Missing   Pulmonary shortness of breath and with exertion, pneumonia, COPD,    Pulmonary exam normal breath sounds clear to auscultation       Cardiovascular Exercise Tolerance: Good hypertension, Pt. on medications + angina + CAD, + Cardiac Stents and + DOE  Normal cardiovascular exam Rhythm:Regular Rate:Normal     Neuro/Psych negative neurological ROS  negative psych ROS   GI/Hepatic GERD  Medicated and Controlled,  Endo/Other  diabetes, Well Controlled, Type 2  Renal/GU      Musculoskeletal   Abdominal   Peds  Hematology  (+) Blood dyscrasia (C[facility]), ,   Anesthesia Other Findings   Reproductive/Obstetrics                             Anesthesia Physical Anesthesia Plan  ASA: III  Anesthesia Plan: General   Post-op Pain Management:    Induction: Intravenous  PONV Risk Score and Plan: 4 or greater and Ondansetron and Dexamethasone  Airway Management Planned: LMA  Additional Equipment:   Intra-op Plan:   Post-operative Plan: Extubation in OR  Informed Consent: I have reviewed the patients History and Physical, chart, labs and discussed the procedure including the risks, benefits and alternatives for the proposed anesthesia with the patient or authorized representative who has indicated his/her understanding and acceptance.     Dental advisory given  Plan Discussed with: CRNA and Surgeon  Anesthesia Plan Comments:        Anesthesia Quick Evaluation

## 2021-03-18 NOTE — Discharge Instructions (Signed)
Current Urology, 10(1), 1-14. https://[email or web address]">  Hydrocelectomy, Adult, Care After This sheet gives you information about how to care for yourself after your procedure. Your health care provider may also give you more specific instructions. If you have problems or questions, contact your health care provider. What can I expect after the procedure? After your procedure, it is common to have:  Mild discomfort and swelling in the pouch that holds your testicles (scrotum).  Bruising of the scrotum. Follow these instructions at home: Medicines  Take over-the-counter and prescription medicines only as told by your health care provider.  Ask your health care provider if the medicine prescribed to you: ? Requires you to avoid driving or using heavy machinery. ? Can cause constipation. You may need to take these actions to prevent or treat constipation:  Drink enough fluid to keep your urine pale yellow.  Take over-the-counter or prescription medicines.  Eat foods that are high in fiber, such as beans, whole grains, and fresh fruits and vegetables.  Limit foods that are high in fat and processed sugars, such as fried or sweet foods. Bathing  Do not take baths, swim, or use a hot tub until your health care provider approves. Ask your health care provider if you may take showers. You may only be allowed to take sponge baths.  If you were told to wear an athletic support strap (scrotal support), keep it dry. Take it off when you shower or bathe. Incision care  Follow instructions from your health care provider about how to take care of your incision. Make sure you: ? Wash your hands with soap and water before and after you change your bandage (dressing). If soap and water are not available, use hand sanitizer. ? Change your dressing as told by your health care provider. ? Leave stitches (sutures), skin glue, or adhesive strips in place. These skin closures may need to stay in  place for 2 weeks or longer. If adhesive strip edges start to loosen and curl up, you may trim the loose edges. Do not remove adhesive strips completely unless your health care provider tells you to do that.  Check your incision and scrotum every day for signs of infection. Check for: ? More redness, swelling, or pain. ? Fluid or blood. ? Warmth. ? Pus or a bad smell.   Managing pain and swelling If directed, put ice on the affected area. To do this:  Put ice in a plastic bag.  Place a towel between your skin and the bag.  Leave the ice on for 20 minutes, 2-3 times per day.   Activity  Do not do any high-energy activities for as long as told by your health care provider.  Do not lift anything that is heavier than 10 lb (4.5 kg), or the limit that you are told, until your health care provider says that it is safe.  Return to your normal activities as told by your health care provider. Ask your health care provider what activities are safe for you.  Do not drive for 24 hours if you were given a sedative during your procedure.  Ask your health care provider when it is safe to drive. General instructions  Do not use any products that contain nicotine or tobacco, such as cigarettes, e-cigarettes, and chewing tobacco. These can delay incision healing after surgery. If you need help quitting, ask your health care provider.  If you were given a scrotal support, wear it as told by your health care provider.  If you had a drain put in during the procedure, you will need to have it removed at a follow-up visit.  Keep all follow-up visits as told by your health care provider. This is important. Contact a health care provider if:  Your pain is not controlled with medicine.  You have more redness, swelling, or pain around your scrotum.  You have fluid or blood coming from your incision.  Your incision feels warm to the touch.  You have pus or a bad smell coming from your  scrotum.  You have a fever. Get help right away if:  You develop shaking, chills, and a fever that is higher than 101.25F (38.8C).  You have redness or swelling that starts at your scrotum and spreads outward to cover your whole groin.  You develop swelling of the legs or difficulty breathing. Summary  After a hydrocelectomy, it is common to have mild discomfort, swelling, and bruising.  Do not take baths, swim, or use a hot tub until your health care provider approves. Ask your health care provider if you may take showers.  If directed, put ice on the affected area to help with pain and swelling.  Do not do any high-energy activities or lift anything heavier than 10 lb (4.5 kg) for as long as told by your health care provider.  If you were given a scrotal support, keep it dry. Wear the scrotal support as told by your health care provider. This information is not intended to replace advice given to you by your health care provider. Make sure you discuss any questions you have with your health care provider. Document Revised: 02/22/2019 Document Reviewed: 02/22/2019 Elsevier Patient Education  [place].

## 2021-03-18 NOTE — Progress Notes (Signed)
Patient is allergic to Penicillin.  Rash later in the afternoon after injection.  Dr Alyson Ingles notified.  OK to give Ancef pre op

## 2021-03-19 ENCOUNTER — Encounter (HOSPITAL_COMMUNITY): Payer: Self-pay | Admitting: Urology

## 2021-03-19 LAB — SURGICAL PATHOLOGY

## 2021-04-03 ENCOUNTER — Ambulatory Visit: Payer: Medicare Other | Admitting: Urology

## 2021-04-05 ENCOUNTER — Encounter: Payer: Self-pay | Admitting: Urology

## 2021-04-05 ENCOUNTER — Ambulatory Visit: Payer: Medicare Other | Admitting: Urology

## 2021-04-05 ENCOUNTER — Other Ambulatory Visit: Payer: Self-pay

## 2021-04-05 VITALS — BP 129/62 | HR 96

## 2021-04-05 DIAGNOSIS — N433 Hydrocele, unspecified: Secondary | ICD-10-CM

## 2021-04-05 LAB — URINALYSIS, ROUTINE W REFLEX MICROSCOPIC
Bilirubin, UA: NEGATIVE
Ketones, UA: NEGATIVE
Leukocytes,UA: NEGATIVE
Nitrite, UA: NEGATIVE
Protein,UA: NEGATIVE
RBC, UA: NEGATIVE
Specific Gravity, UA: 1.01 (ref 1.005–1.030)
Urobilinogen, Ur: 0.2 mg/dL (ref 0.2–1.0)
pH, UA: 7 (ref 5.0–7.5)

## 2021-04-05 NOTE — Progress Notes (Signed)
04/05/2021 10:00 AM   Lonnie Duffy 1940-11-04 [id number]  Referring provider: Caryl Bis, MD [place],  [place] [address]  Followup after hydrocelectomy   HPI: [name] is a 80yo here for followup after right hydrocelectomy 2 weeks ago. 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[place])    COPD (chronic obstructive pulmonary disease) ([place])    Coronary artery disease    Diabetes mellitus without complication (HCC)    Dyspnea    GERD (gastroesophageal reflux disease)    Hypertension    Leukemia ([place])    Palpitations    Pneumonia 04/2020    Surgical History: Past Surgical History:  Procedure Laterality Date   APPENDECTOMY  1987   CARDIAC CATHETERIZATION  2019   CATARACT EXTRACTION  11/2009 & 12/2015   CORONARY STENT INTERVENTION  06/28/2018   CORONARY STENT INTERVENTION N/A 06/28/2018   Procedure: CORONARY STENT INTERVENTION;  Surgeon: Nelva Bush, MD;  Location: [facility];  Service: Cardiovascular;  Laterality: N/A;   GALLBLADDER SURGERY  1989   HYDROCELE EXCISION Right 03/18/2021   Procedure: HYDROCELECTOMY ADULT;  Surgeon: Cleon Gustin, MD;  Location: [facility];  Service: Urology;  Laterality: Right;   INTRAVASCULAR ULTRASOUND/IVUS N/A 06/28/2018   Procedure: Intravascular Ultrasound/IVUS;  Surgeon: Nelva Bush, MD;  Location: [facility];  Service: Cardiovascular;  Laterality: N/A;   NASAL SINUS SURGERY  11/21/1987   RIGHT/LEFT HEART CATH AND CORONARY ANGIOGRAPHY N/A 06/28/2018   Procedure: RIGHT/LEFT HEART CATH AND CORONARY ANGIOGRAPHY;  Surgeon: Nelva Bush, MD;  Location: [facility];  Service: Cardiovascular;  Laterality: N/A;    SHOULDER SURGERY  2007   stent in lad  06/07/2004   TONSILLECTOMY  1971   trigerfinger  1990    Home Medications:  Allergies as of 04/05/2021       Reactions   Eggs Or Egg-derived Products    Drainage in the back of throat   Flagyl [metronidazole]    Couldn't urinate   Penicillins Rash   Has patient had a PCN reaction causing immediate rash, facial/tongue/throat swelling, SOB or lightheadedness with hypotension: Yes Has patient had a PCN reaction causing severe rash involving mucus membranes or skin necrosis: No Has patient had a PCN reaction that required hospitalization: No Has patient had a PCN reaction occurring within the last 10 years: No If all of the above answers are "NO", then may proceed with Cephalosporin use.'        Medication List        Accurate as of April 05, 2021 10:00 AM. If you have any questions, ask your nurse or doctor.          acetaminophen 500 MG tablet Commonly known as: TYLENOL Take 1,000 mg by mouth every 6 (six) hours as needed for moderate pain.   aspirin EC 81 MG tablet Take 162 mg by mouth daily.   atorvastatin 10 MG tablet Commonly known as: LIPITOR Take 10 mg by mouth at bedtime.   Fish Oil 1000 MG Caps Take 2,000 mg by mouth 2 (two) times daily.   fluconazole 150 MG tablet Commonly known as: DIFLUCAN Take  150 mg by mouth daily.   fluticasone 50 MCG/ACT nasal spray Commonly known as: FLONASE Place 2 sprays into both nostrils daily.   folic acid 1 MG tablet Commonly known as: FOLVITE Take 1 mg by mouth daily.   furosemide 20 MG tablet Commonly known as: LASIX Take 20 mg by mouth daily as needed for fluid.   gabapentin 600 MG tablet Commonly known as: NEURONTIN Take 600 mg by mouth 3 (three) times daily.   imatinib 100 MG tablet Commonly known as: GLEEVEC Take 300 mg by mouth daily at 6 (six) AM.   losartan-hydrochlorothiazide 100-12.5 MG tablet Commonly known as: HYZAAR Take 0.5 tablets by mouth daily. What  changed: how much to take   magic mouthwash Soln Take 5 mLs by mouth daily as needed for mouth pain.   nitroGLYCERIN 0.4 MG SL tablet Commonly known as: NITROSTAT Place 0.4 mg under the tongue every 5 (five) minutes as needed for chest pain.   nystatin ointment Commonly known as: MYCOSTATIN Apply 1 application topically daily as needed for dry skin.   ondansetron 8 MG tablet Commonly known as: ZOFRAN Take 8 mg by mouth every 8 (eight) hours as needed for vomiting or nausea.   oxyCODONE-acetaminophen 5-325 MG tablet Commonly known as: Percocet Take 1 tablet by mouth every 4 (four) hours as needed for severe pain.   pantoprazole 40 MG tablet Commonly known as: PROTONIX Take 40 mg by mouth daily.   polyethylene glycol 17 g packet Commonly known as: MIRALAX / GLYCOLAX Take 17 g by mouth daily.   predniSONE 5 MG tablet Commonly known as: DELTASONE Take 15 mg by mouth daily.   tamsulosin 0.4 MG Caps capsule Commonly known as: FLOMAX Take 0.4 mg by mouth daily.   valACYclovir 1000 MG tablet Commonly known as: VALTREX Take 1,000 mg by mouth daily.   Vitamin D3 125 MCG (5000 UT) Tabs Take 5,000 Units by mouth daily.        Allergies:  Allergies  Allergen Reactions   Eggs Or Egg-Derived Products     Drainage in the back of throat   Flagyl [Metronidazole]     Couldn't urinate   Penicillins Rash    Has patient had a PCN reaction causing immediate rash, facial/tongue/throat swelling, SOB or lightheadedness with hypotension: Yes Has patient had a PCN reaction causing severe rash involving mucus membranes or skin necrosis: No Has patient had a PCN reaction that required hospitalization: No Has patient had a PCN reaction occurring within the last 10 years: No If all of the above answers are "NO", then may proceed with Cephalosporin use.'    Family History: Family History  Problem Relation Age of Onset   Heart attack Mother    Heart attack Father    Diabetes Father      Social History:  reports that he has never smoked. He has never used smokeless tobacco. He reports that he does not drink alcohol and does not use drugs.  ROS: All other review of systems were reviewed and are negative except what is noted above in HPI  Physical Exam: BP 129/62   Pulse 96   Constitutional:  Alert and oriented, No acute distress. HEENT: [place] AT, moist mucus membranes.  Trachea midline, no masses. Cardiovascular: No clubbing, cyanosis, or edema. Respiratory: Normal respiratory effort, no increased work of breathing. GI: Abdomen is soft, nontender, nondistended, no abdominal masses GU: No CVA tenderness. Circumcised phallus. Healing scrotal incision Lymph: No cervical or inguinal lymphadenopathy. Skin: No rashes, bruises  or suspicious lesions. Neurologic: Grossly intact, no focal deficits, moving all 4 extremities. Psychiatric: Normal mood and affect.  Laboratory Data: Lab Results  Component Value Date   WBC 7.4 06/29/2018   HGB 11.6 (L) 06/29/2018   HCT 34.9 (L) 06/29/2018   MCV 92.1 06/29/2018   PLT 237 06/29/2018    Lab Results  Component Value Date   CREATININE 1.19 03/15/2021    No results found for: PSA  No results found for: TESTOSTERONE  No results found for: HGBA1C  Urinalysis    Component Value Date/Time   APPEARANCEUR Clear 01/29/2021 1636   GLUCOSEU 2+ (A) 01/29/2021 1636   BILIRUBINUR Negative 01/29/2021 1636   PROTEINUR Negative 01/29/2021 1636   NITRITE Negative 01/29/2021 1636   LEUKOCYTESUR Negative 01/29/2021 1636    No results found for: LABMICR, WBCUA, RBCUA, LABEPIT, MUCUS, BACTERIA  Pertinent Imaging:  No results found for this or any previous visit.  No results found for this or any previous visit.  No results found for this or any previous visit.  No results found for this or any previous visit.  No results found for this or any previous visit.  No results found for this or any previous visit.  No results  found for this or any previous visit.  No results found for this or any previous visit.   Assessment & Plan:    1. Hydrocele, unspecified hydrocele type RTC 6 weeks for wound check   No follow-ups on file.  Nicolette Bang, MD  [facility] [place]

## 2021-04-05 NOTE — Progress Notes (Signed)
Urological Symptom Review  Patient is experiencing the following symptoms: Get up at night to urinate   Review of Systems  Gastrointestinal (upper)  : Nausea  Gastrointestinal (lower) : Negative for symptoms  Constitutional : Fatigue  Skin: Negative for skin symptoms  Eyes: Sore throat Sinus problems  Ear/Nose/Throat : Negative for Ear/Nose/Throat symptoms  Hematologic/Lymphatic: Bruise/bleed easily  Cardiovascular : Negative for cardiovascular symptoms  Respiratory : Shortness of breath  Endocrine: Negative for endocrine symptoms  Musculoskeletal: Joint pain  Neurological: Negative for neurological symptoms  Psychologic: Negative for psychiatric symptoms

## 2021-04-05 NOTE — Patient Instructions (Signed)
Current Urology, 10(1), 1-14. [email or web address]">  Hydrocelectomy, Adult, Care After This sheet gives you information about how to care for yourself after your procedure. Your health care provider may also give you more specific instructions. If you have problems or questions, contact your health careprovider. What can I expect after the procedure? After your procedure, it is common to have: Mild discomfort and swelling in the pouch that holds your testicles (scrotum). Bruising of the scrotum. Follow these instructions at home: Medicines Take over-the-counter and prescription medicines only as told by your health care provider. Ask your health care provider if the medicine prescribed to you: Requires you to avoid driving or using heavy machinery. Can cause constipation. You may need to take these actions to prevent or treat constipation: Drink enough fluid to keep your urine pale yellow. Take over-the-counter or prescription medicines. Eat foods that are high in fiber, such as beans, whole grains, and fresh fruits and vegetables. Limit foods that are high in fat and processed sugars, such as fried or sweet foods. Bathing Do not take baths, swim, or use a hot tub until your health care provider approves. Ask your health care provider if you may take showers. You may only be allowed to take sponge baths. If you were told to wear an athletic support strap (scrotal support), keep it dry. Take it off when you shower or bathe. Incision care  Follow instructions from your health care provider about how to take care of your incision. Make sure you: Wash your hands with soap and water before and after you change your bandage (dressing). If soap and water are not available, use hand sanitizer. Change your dressing as told by your health care provider. Leave stitches (sutures), skin glue, or adhesive strips in place. These skin closures may need to stay in place for 2 weeks or longer.  If adhesive strip edges start to loosen and curl up, you may trim the loose edges. Do not remove adhesive strips completely unless your health care provider tells you to do that. Check your incision and scrotum every day for signs of infection. Check for: More redness, swelling, or pain. Fluid or blood. Warmth. Pus or a bad smell.  Managing pain and swelling If directed, put ice on the affected area. To do this: Put ice in a plastic bag. Place a towel between your skin and the bag. Leave the ice on for 20 minutes, 2-3 times per day.  Activity Do not do any high-energy activities for as long as told by your health care provider. Do not lift anything that is heavier than 10 lb (4.5 kg), or the limit that you are told, until your health care provider says that it is safe. Return to your normal activities as told by your health care provider. Ask your health care provider what activities are safe for you. Do not drive for 24 hours if you were given a sedative during your procedure. Ask your health care provider when it is safe to drive. General instructions Do not use any products that contain nicotine or tobacco, such as cigarettes, e-cigarettes, and chewing tobacco. These can delay incision healing after surgery. If you need help quitting, ask your health care provider. If you were given a scrotal support, wear it as told by your health care provider. If you had a drain put in during the procedure, you will need to have it removed at a follow-up visit. Keep all follow-up visits as told by your health care provider. This is  important. Contact a health care provider if: Your pain is not controlled with medicine. You have more redness, swelling, or pain around your scrotum. You have fluid or blood coming from your incision. Your incision feels warm to the touch. You have pus or a bad smell coming from your scrotum. You have a fever. Get help right away if: You develop shaking, chills, and  a fever that is higher than 101.48F (38.8C). You have redness or swelling that starts at your scrotum and spreads outward to cover your whole groin. You develop swelling of the legs or difficulty breathing. Summary After a hydrocelectomy, it is common to have mild discomfort, swelling, and bruising. Do not take baths, swim, or use a hot tub until your health care provider approves. Ask your health care provider if you may take showers. If directed, put ice on the affected area to help with pain and swelling. Do not do any high-energy activities or lift anything heavier than 10 lb (4.5 kg) for as long as told by your health care provider. If you were given a scrotal support, keep it dry. Wear the scrotal support as told by your health care provider. This information is not intended to replace advice given to you by your health care provider. Make sure you discuss any questions you have with your healthcare provider. Document Revised: 02/22/2019 Document Reviewed: 02/22/2019 Elsevier Patient Education  [place].

## 2021-05-15 ENCOUNTER — Ambulatory Visit: Payer: Medicare Other | Admitting: Urology

## 2021-05-15 ENCOUNTER — Other Ambulatory Visit: Payer: Self-pay

## 2021-05-15 ENCOUNTER — Encounter: Payer: Self-pay | Admitting: Urology

## 2021-05-15 VITALS — BP 130/66 | HR 85

## 2021-05-15 DIAGNOSIS — N433 Hydrocele, unspecified: Secondary | ICD-10-CM

## 2021-05-15 LAB — URINALYSIS, ROUTINE W REFLEX MICROSCOPIC
Bilirubin, UA: NEGATIVE
Ketones, UA: NEGATIVE
Leukocytes,UA: NEGATIVE
Nitrite, UA: NEGATIVE
Protein,UA: NEGATIVE
RBC, UA: NEGATIVE
Specific Gravity, UA: 1.01 (ref 1.005–1.030)
Urobilinogen, Ur: 0.2 mg/dL (ref 0.2–1.0)
pH, UA: 7 (ref 5.0–7.5)

## 2021-05-15 NOTE — Progress Notes (Signed)
Urological Symptom Review  Patient is experiencing the following symptoms: Frequent urination   Review of Systems  Gastrointestinal (upper)  : Nausea Indigestion/heartburn  Gastrointestinal (lower) : Negative for lower GI symptoms  Constitutional : Fatigue  Skin: Negative for skin symptoms  Eyes: Negative for eye symptoms  Ear/Nose/Throat : Sore throat Sinus problems  Hematologic/Lymphatic: Negative for Hematologic/Lymphatic symptoms  Cardiovascular : Negative for cardiovascular symptoms  Respiratory : Negative for respiratory symptoms  Endocrine: Excessive thirst  Musculoskeletal: Back pain Joint pain  Neurological: Headaches  Psychologic: Negative for psychiatric symptoms

## 2021-05-15 NOTE — Patient Instructions (Signed)
Hydrocele, Adult A hydrocele is a collection of fluid in the loose pouch of skin that holds the testicles (scrotum). This may happen because: The amount of fluid produced in the scrotum is not absorbed by the rest of the body. Fluid from the abdomen fills the scrotum. Normally, the testicles develop in the abdomen then drop into to the scrotum before birth. The tube that the testicles travel through usually closes after the testicles drop. If the tube does not close, fluid from the abdomen can fill the scrotum. This is not very common in adults. What are the causes? A hydrocele may be caused by: An injury to the scrotum. An infection. Decreased blood flow to the scrotum. Twisting of a testicle (testicular torsion). A birth defect. A tumor or cancer of the testicle. Sometimes, the cause is not known. What are the signs or symptoms? A hydrocele feels like a water-filled balloon. It may also feel heavy. Other symptoms include: Swelling of the scrotum. The swelling may decrease when you lie down. You may also notice more swelling at night than in the morning. This is called a communicating hydrocele, in which the fluid in the scrotum goes back into the abdominal cavity when the position of the scrotum changes. Swelling of the groin. Mild discomfort in the scrotum. Pain. This can develop if the hydrocele was caused by infection or twisting. The larger the hydrocele, the more likely you are to have pain. Swelling may also cause pain. How is this diagnosed? This condition may be diagnosed based on a physical exam and your medical history. You may also have tests, including: Imaging tests, such as ultrasound. A transillumination test. This test takes place in a dark room; a light is placed on the skin of the scrotum. Clear liquid will not impede the light and the scrotum will be illuminated. This helps a health care provider distinguish a hydrocele from a tumor. Blood or urine tests. How is this  treated? Most hydroceles go away on their own. If you have no discomfort or pain, your health care provider may suggest close monitoring of your condition (called watch and wait or watchful waiting) until the condition goes away or symptoms develop. If treatment is needed, it may include: Treating an underlying condition. This may include taking an antibiotic medicine to treat an infection. Having surgery to stop fluid from collecting in the scrotum. Having surgery to drain the fluid. Surgery may include: Hydrocelectomy. For this procedure, an incision is made in the scrotum to remove the fluid sac. Needle aspiration. A needle is used to drain fluid. However, the fluid buildup will come back quickly and may lead to an infection of the scrotum. This treatment is rarely used. Follow these instructions at home: Medicines Take over-the-counter and prescription medicines only as told by your health care provider. If you were prescribed an antibiotic medicine, take it as told by your health care provider. Do not stop taking the antibiotic even if you start to feel better. General instructions Watch the hydrocele for any changes. Keep all follow-up visits as told by your health care provider. This is important. Contact a health care provider if: You notice any changes in the hydrocele. The swelling in your scrotum or groin gets worse. The hydrocele becomes red, firm, painful, or tender to the touch. You have a fever. Get help right away if you: Develop a lot of pain or your pain becomes worse. Have shaking chills. Have a high fever. Summary A hydrocele is a collection  of fluid in the loose pouch of skin that holds the testicles (scrotum). A hydrocele can cause swelling, discomfort, and pain. In adults, the cause of a hydrocele may not be known. However, it is sometimes caused by an infection or a rotation and twisting of the scrotum. Treatment may not be needed. Hydroceles often go away on their  own. If a hydrocele causes pain, treatment may be given to ease the pain. This information is not intended to replace advice given to you by your health care provider. Make sure you discuss any questions you have with your healthcare provider. Document Revised: 07/27/2019 Document Reviewed: 07/27/2019 Elsevier Patient Education  [place].

## 2021-05-15 NOTE — Progress Notes (Signed)
05/15/2021 9:44 AM   Lonnie Duffy 1940/10/18 [id number]  Referring provider: Caryl Bis, MD [place],  [place] [id number]  Followup after right hydrocelectomy   HPI: [name] is a 80yo here for followup after right hydrocelectomy. He denies any testicular pain. Incision healed and no drainage from incision. 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[place])    COPD (chronic obstructive pulmonary disease) ([place])    Coronary artery disease    Diabetes mellitus without complication (H[facility])    Dyspnea    GERD (gastroesophageal reflux disease)    Hypertension    Leukemia ([place])    Palpitations    Pneumonia 04/2020    Surgical History: Past Surgical History:  Procedure Laterality Date   APPENDECTOMY  1987   CARDIAC CATHETERIZATION  2019   CATARACT EXTRACTION  11/2009 & 12/2015   CORONARY STENT INTERVENTION  06/28/2018   CORONARY STENT INTERVENTION N/A 06/28/2018   Procedure: CORONARY STENT INTERVENTION;  Surgeon: Nelva Bush, MD;  Location: [facility];  Service: Cardiovascular;  Laterality: N/A;   GALLBLADDER SURGERY  1989   HYDROCELE EXCISION Right 03/18/2021   Procedure: HYDROCELECTOMY ADULT;  Surgeon: Cleon Gustin, MD;  Location: [facility];  Service: Urology;  Laterality: Right;   INTRAVASCULAR ULTRASOUND/IVUS N/A 06/28/2018   Procedure: Intravascular Ultrasound/IVUS;  Surgeon: Nelva Bush, MD;  Location: [facility];  Service: Cardiovascular;  Laterality: N/A;   NASAL SINUS SURGERY  11/21/1987   RIGHT/LEFT HEART CATH AND CORONARY ANGIOGRAPHY N/A 06/28/2018   Procedure: RIGHT/LEFT HEART CATH AND CORONARY ANGIOGRAPHY;  Surgeon: Nelva Bush, MD;  Location: [facility];  Service:  Cardiovascular;  Laterality: N/A;   SHOULDER SURGERY  2007   stent in lad  06/07/2004   TONSILLECTOMY  1971   trigerfinger  1990    Home Medications:  Allergies as of 05/15/2021       Reactions   Eggs Or Egg-derived Products    Drainage in the back of throat   Flagyl [metronidazole]    Couldn't urinate   Penicillins Rash   Has patient had a PCN reaction causing immediate rash, facial/tongue/throat swelling, SOB or lightheadedness with hypotension: Yes Has patient had a PCN reaction causing severe rash involving mucus membranes or skin necrosis: No Has patient had a PCN reaction that required hospitalization: No Has patient had a PCN reaction occurring within the last 10 years: No If all of the above answers are "NO", then may proceed with Cephalosporin use.'        Medication List        Accurate as of May 15, 2021  9:44 AM. If you have any questions, ask your nurse or doctor.          acetaminophen 500 MG tablet Commonly known as: TYLENOL Take 1,000 mg by mouth every 6 (six) hours as needed for moderate pain.   aspirin EC 81 MG tablet Take 162 mg by mouth daily.   atorvastatin 10 MG tablet Commonly known as: LIPITOR Take 10 mg by mouth at bedtime.   Fish Oil 1000 MG Caps Take 2,000 mg by mouth 2 (two) times daily.   fluconazole 150 MG tablet Commonly  known as: DIFLUCAN Take 150 mg by mouth daily.   fluticasone 50 MCG/ACT nasal spray Commonly known as: FLONASE Place 2 sprays into both nostrils daily.   folic acid 1 MG tablet Commonly known as: FOLVITE Take 1 mg by mouth daily.   furosemide 20 MG tablet Commonly known as: LASIX Take 20 mg by mouth daily as needed for fluid.   gabapentin 600 MG tablet Commonly known as: NEURONTIN Take 600 mg by mouth 3 (three) times daily.   imatinib 100 MG tablet Commonly known as: GLEEVEC Take 300 mg by mouth daily at 6 (six) AM.   losartan-hydrochlorothiazide 100-12.5 MG tablet Commonly known as: HYZAAR Take  0.5 tablets by mouth daily. What changed: how much to take   magic mouthwash Soln Take 5 mLs by mouth daily as needed for mouth pain.   nitroGLYCERIN 0.4 MG SL tablet Commonly known as: NITROSTAT Place 0.4 mg under the tongue every 5 (five) minutes as needed for chest pain.   ondansetron 8 MG tablet Commonly known as: ZOFRAN Take 8 mg by mouth every 8 (eight) hours as needed for vomiting or nausea.   oxyCODONE-acetaminophen 5-325 MG tablet Commonly known as: Percocet Take 1 tablet by mouth every 4 (four) hours as needed for severe pain.   pantoprazole 40 MG tablet Commonly known as: PROTONIX Take 40 mg by mouth daily.   polyethylene glycol 17 g packet Commonly known as: MIRALAX / GLYCOLAX Take 17 g by mouth daily.   predniSONE 5 MG tablet Commonly known as: DELTASONE Take 15 mg by mouth daily.   tamsulosin 0.4 MG Caps capsule Commonly known as: FLOMAX Take 0.4 mg by mouth daily.   valACYclovir 1000 MG tablet Commonly known as: VALTREX Take 1,000 mg by mouth daily.   Vitamin D3 125 MCG (5000 UT) Tabs Take 5,000 Units by mouth daily.        Allergies:  Allergies  Allergen Reactions   Eggs Or Egg-Derived Products     Drainage in the back of throat   Flagyl [Metronidazole]     Couldn't urinate   Penicillins Rash    Has patient had a PCN reaction causing immediate rash, facial/tongue/throat swelling, SOB or lightheadedness with hypotension: Yes Has patient had a PCN reaction causing severe rash involving mucus membranes or skin necrosis: No Has patient had a PCN reaction that required hospitalization: No Has patient had a PCN reaction occurring within the last 10 years: No If all of the above answers are "NO", then may proceed with Cephalosporin use.'    Family History: Family History  Problem Relation Age of Onset   Heart attack Mother    Heart attack Father    Diabetes Father     Social History:  reports that he has never smoked. He has never used  smokeless tobacco. He reports that he does not drink alcohol and does not use drugs.  ROS: All other review of systems were reviewed and are negative except what is noted above in HPI  Physical Exam: BP 130/66   Pulse 85   Constitutional:  Alert and oriented, No acute distress. HEENT: [place] AT, moist mucus membranes.  Trachea midline, no masses. Cardiovascular: No clubbing, cyanosis, or edema. Respiratory: Normal respiratory effort, no increased work of breathing. GI: Abdomen is soft, nontender, nondistended, no abdominal masses GU: No CVA tenderness. Circumcised phallus. No masses/lesions on penis, testis, scrotum. Well healed scrotal incision. Lymph: No cervical or inguinal lymphadenopathy. Skin: No rashes, bruises or suspicious lesions. Neurologic: Grossly intact, no  focal deficits, moving all 4 extremities. Psychiatric: Normal mood and affect.  Laboratory Data: Lab Results  Component Value Date   WBC 7.4 06/29/2018   HGB 11.6 (L) 06/29/2018   HCT 34.9 (L) 06/29/2018   MCV 92.1 06/29/2018   PLT 237 06/29/2018    Lab Results  Component Value Date   CREATININE 1.19 03/15/2021    No results found for: PSA  No results found for: TESTOSTERONE  No results found for: HGBA1C  Urinalysis    Component Value Date/Time   APPEARANCEUR Clear 04/05/2021 1021   GLUCOSEU Trace (A) 04/05/2021 1021   BILIRUBINUR Negative 04/05/2021 1021   PROTEINUR Negative 04/05/2021 1021   NITRITE Negative 04/05/2021 1021   LEUKOCYTESUR Negative 04/05/2021 1021    Lab Results  Component Value Date   LABMICR Comment 04/05/2021    Pertinent Imaging:  No results found for this or any previous visit.  No results found for this or any previous visit.  No results found for this or any previous visit.  No results found for this or any previous visit.  No results found for this or any previous visit.  No results found for this or any previous visit.  No results found for this or any  previous visit.  No results found for this or any previous visit.   Assessment & Plan:    1. Hydrocele, unspecified hydrocele type -resolved. RTC prn - Urinalysis, Routine w reflex microscopic   Return if symptoms worsen or fail to improve.  Nicolette Bang, MD  [facility]

## 2021-05-24 ENCOUNTER — Ambulatory Visit (INDEPENDENT_AMBULATORY_CARE_PROVIDER_SITE_OTHER): Payer: Medicare Other | Admitting: Pulmonary Disease

## 2021-05-24 ENCOUNTER — Other Ambulatory Visit: Payer: Self-pay

## 2021-05-24 ENCOUNTER — Ambulatory Visit: Payer: Medicare Other | Admitting: Pulmonary Disease

## 2021-05-24 ENCOUNTER — Encounter: Payer: Self-pay | Admitting: Pulmonary Disease

## 2021-05-24 VITALS — BP 140/70 | HR 95 | Temp 98.1°F | Ht 66.5 in | Wt 189.6 lb

## 2021-05-24 DIAGNOSIS — R0609 Other forms of dyspnea: Secondary | ICD-10-CM

## 2021-05-24 DIAGNOSIS — R0600 Dyspnea, unspecified: Secondary | ICD-10-CM

## 2021-05-24 DIAGNOSIS — J849 Interstitial pulmonary disease, unspecified: Secondary | ICD-10-CM | POA: Diagnosis not present

## 2021-05-24 DIAGNOSIS — M069 Rheumatoid arthritis, unspecified: Secondary | ICD-10-CM

## 2021-05-24 LAB — PULMONARY FUNCTION TEST
DL/VA % pred: 93 %
DL/VA: 3.74 ml/min/mmHg/L
DLCO cor % pred: 69 %
DLCO cor: 15.56 ml/min/mmHg
DLCO unc % pred: 69 %
DLCO unc: 15.56 ml/min/mmHg
FEF 25-75 Pre: 3.17 L/sec
FEF2575-%Pred-Pre: 175 %
FEV1-%Pred-Pre: 80 %
FEV1-Pre: 2.06 L
FEV1FVC-%Pred-Pre: 120 %
FEV6-%Pred-Pre: 70 %
FEV6-Pre: 2.36 L
FEV6FVC-%Pred-Pre: 107 %
FVC-%Pred-Pre: 66 %
FVC-Pre: 2.37 L
Pre FEV1/FVC ratio: 87 %
Pre FEV6/FVC Ratio: 100 %

## 2021-05-24 NOTE — Patient Instructions (Addendum)
We will order you a nebulizer machine and duoneb solution to be used as needed every 4-6 hours.   We will schedule you for a high resolution CT chest scan in December for follow up of your interstitial lung disease.   Your lung function is stable on repeat pulmonary function testing today.

## 2021-05-24 NOTE — Progress Notes (Signed)
Synopsis: Referred by Gar Ponto for shortness of breath  Subjective:   PATIENT ID: Lonnie Duffy GENDER: male DOB: 1941/01/26, MRN: [id number]  Was coughing a lot with anoro Has noticed improvement with nebulizer treatments Still having dyspnea with exertion. Still out mowing.  Seeing a rheumatologist now who is working on tapering his steroids  HPI  Chief Complaint  Patient presents with   Follow-up    F/U after PFT. States he has been more SOB with exertion lately due to the heat. Increased wheezing at night.    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  He has been doing well since last visit visit. He continues to have significant shortness of breath with activity and increased fatigue with activity. The anoro made his cough worse. He is requesting a nebulizer machine and nebulizer treatments as he felt these have helped in the past with his breathing.  He is now seeing a rheumatologist for his RA. He remains on '15mg'$  daily and will be tapering down in the future with the addition of another RA treatment.  PFTs today show FEV1/FVC 87%, FEV1 2.06L (80%), FVC 2.37L (66%) and DLCO 15.56 (69%), relatively stable since 09/2020.   OV 08/21/20 He was diagnosed with rheumatoid arthritis 8 years ago and has been managed on methotrexate and '5mg'$  of prednisone daily for the joint pain.   The patient was diagnosed with CML in 01/2020 and was started on Dasatinib in 02/2020. By the end of May he was noted to be having increasing shortness of breath and wheezing. A CT chest scan 03/02/20 showed bilateral peripherally predominant patchy areas of ground glass attentuation, interstitial thickening and reticulation with mild traction bronchiectasis. Cardiac echo at this time showed EF 60-65%, mild aortic stenosis and mild left atrial dilation with normal RV function. 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  He had initial improvement in his breathing with the steroid taper but he continued to experience exertional shortness of breath with work. He was first seen in pulmonary clinic 07/03/20 and reports his breathing has been the same since then with no further improvement. He has remained on '10mg'$  of prednisone daily for the rheumatoid arthritis, which he has been experiencing pain in his hands.   He was last seen by hematology/oncology on 08/02/20. He is currently on '200mg'$  of Imatinib daily for the CML as he had an increase in his WBC count at that time. He continues to have some mucositis symptoms which are maintained with magic mouth wash.    Pulmonary Function testing today shows FEV1/FVC 88, FEV1 2.22L (86%), FVC 2.53L (70%), TLC 5.2L (82%), DLCO 14.99 (67%)  Past Medical History:  Diagnosis Date   ASCVD (arteriosclerotic cardiovascular disease)    Chest pain    Collagen vascular disease (HCC)    Complication of anesthesia    "I had local anesthesia , and I went out for 3 hrs "   Connective tissue disorder (HCC)    COPD (chronic obstructive pulmonary disease) ([place])    Coronary artery disease    Diabetes mellitus without complication (HCC)    Dyspnea    GERD (gastroesophageal reflux disease)    Hypertension    Leukemia (HCC)    Palpitations    Pneumonia 04/2020     Family History  Problem Relation Age of Onset   Heart attack Mother    Heart attack Father    Diabetes Father      Social History   Socioeconomic History   Marital status: Married    Spouse name: Not on file   Number of children: Not on file   Years of education: Not on file   Highest education level: Not on file  Occupational History   Not on file  Tobacco Use   Smoking status: Never   Smokeless tobacco: Never  Vaping Use   Vaping Use: Never used  Substance and Sexual Activity   Alcohol use:  Never   Drug use: Never   Sexual activity: Not on file  Other Topics Concern   Not on file  Social History Narrative   Not on file   Social Determinants of Health   Financial Resource Strain: Not on file  Food Insecurity: Not on file  Transportation Needs: Not on file  Physical Activity: Not on file  Stress: Not on file  Social Connections: Not on file  Intimate Partner Violence: Not on file     Allergies  Allergen Reactions   Oxycodone Shortness Of Breath   Eggs Or Egg-Derived Products     Drainage in the back of throat   Flagyl [Metronidazole]     Couldn't urinate   Penicillins Rash    Has patient had a PCN reaction causing immediate rash, facial/tongue/throat swelling, SOB or lightheadedness with hypotension: Yes Has patient had a PCN reaction causing severe rash involving mucus membranes or skin necrosis: No Has patient had a PCN reaction that required hospitalization: No Has patient had a PCN reaction occurring within the last 10 years: No If all of the above answers are "NO", then may proceed with Cephalosporin use.'     Outpatient Medications Prior to Visit  Medication Sig Dispense Refill   acetaminophen (TYLENOL) 500 MG tablet Take 1,000 mg by mouth every 6 (six) hours as needed for moderate pain.     aspirin EC 81 MG tablet Take 162 mg by mouth daily.      atorvastatin (LIPITOR) 10 MG tablet Take 10 mg by mouth at bedtime.      Cholecalciferol (VITAMIN D3) 5000 units TABS Take 5,000 Units by mouth daily.      fluconazole (DIFLUCAN) 150 MG tablet Take 150 mg by mouth daily.     fluticasone (FLONASE) 50 MCG/ACT nasal spray Place 2 sprays into both nostrils daily.     folic acid (FOLVITE) 1 MG tablet Take 1 mg by mouth daily.     furosemide (LASIX) 20 MG tablet Take 20 mg by mouth daily as needed for fluid.     gabapentin (NEURONTIN) 600 MG tablet Take 600 mg by mouth 3 (three) times daily.     imatinib (GLEEVEC) 100 MG tablet Take 300 mg by mouth daily at 6 (six)  AM.     losartan-hydrochlorothiazide (HYZAAR) 100-12.5 MG tablet Take 0.5 tablets by mouth daily. (Patient taking differently: Take 1 tablet by mouth daily.)     magic mouthwash SOLN Take 5 mLs by mouth daily as needed for mouth pain.     nitroGLYCERIN (NITROSTAT) 0.4 MG SL tablet Place 0.4 mg under the tongue every 5 (five) minutes as needed for chest pain.     Omega-3 Fatty Acids (FISH OIL) 1000 MG CAPS Take 2,000 mg by mouth 2 (two) times daily.      ondansetron (ZOFRAN) 8 MG tablet Take 8 mg by mouth  every 8 (eight) hours as needed for vomiting or nausea.     pantoprazole (PROTONIX) 40 MG tablet Take 40 mg by mouth daily.     polyethylene glycol (MIRALAX / GLYCOLAX) packet Take 17 g by mouth daily.     predniSONE (DELTASONE) 5 MG tablet Take 15 mg by mouth daily.     tamsulosin (FLOMAX) 0.4 MG CAPS capsule Take 0.4 mg by mouth daily.     valACYclovir (VALTREX) 1000 MG tablet Take 1,000 mg by mouth daily.     oxyCODONE-acetaminophen (PERCOCET) 5-325 MG tablet Take 1 tablet by mouth every 4 (four) hours as needed for severe pain. 30 tablet 0   No facility-administered medications prior to visit.   Review of Systems  Constitutional:  Positive for malaise/fatigue. Negative for chills, diaphoresis, fever and weight loss.  HENT:  Negative for congestion, nosebleeds, sinus pain and sore throat.   Eyes:  Negative for blurred vision.  Respiratory:  Positive for shortness of breath. Negative for cough, hemoptysis, sputum production and wheezing.   Cardiovascular:  Negative for chest pain, palpitations, orthopnea, claudication, leg swelling and PND.  Gastrointestinal:  Negative for abdominal pain, blood in stool, diarrhea, heartburn, melena, nausea and vomiting.  Genitourinary:  Negative for dysuria, frequency and hematuria.  Musculoskeletal:  Positive for joint pain. Negative for myalgias.  Skin:  Negative for itching and rash.  Neurological:  Negative for dizziness, weakness and headaches.   Endo/Heme/Allergies:  Does not bruise/bleed easily.  Psychiatric/Behavioral: Negative.     Objective:   Vitals:   05/24/21 1102  BP: 140/70  Pulse: 95  Temp: 98.1 F (36.7 C)  TempSrc: Oral  SpO2: 96%  Weight: 189 lb 9.6 oz (86 kg)  Height: 5' 6.5" (1.689 m)   Physical Exam Constitutional:      General: He is not in acute distress.    Appearance: Normal appearance. He is normal weight. He is not ill-appearing.  HENT:     Head: Normocephalic and atraumatic.     Nose: Nose normal.     Mouth/Throat:     Mouth: Mucous membranes are moist.     Pharynx: Oropharynx is clear.  Eyes:     Extraocular Movements: Extraocular movements intact.     Conjunctiva/sclera: Conjunctivae normal.     Pupils: Pupils are equal, round, and reactive to light.  Cardiovascular:     Rate and Rhythm: Normal rate and regular rhythm.     Pulses: Normal pulses.     Heart sounds: Murmur (systolic) heard.  Pulmonary:     Effort: Pulmonary effort is normal. No respiratory distress.     Breath sounds: Examination of the left-middle field reveals rales. Rales (dry, left base) present. No wheezing or rhonchi.  Abdominal:     General: Abdomen is flat. Bowel sounds are normal. There is no distension.     Palpations: Abdomen is soft.     Tenderness: There is no abdominal tenderness.  Musculoskeletal:     Cervical back: Neck supple.     Right lower leg: No edema.     Left lower leg: No edema.  Lymphadenopathy:     Cervical: No cervical adenopathy.  Skin:    General: Skin is warm and dry.     Capillary Refill: Capillary refill takes less than 2 seconds.  Neurological:     General: No focal deficit present.     Mental Status: He is alert and oriented to person, place, and time. Mental status is at baseline.     Gait:  Gait normal.    CBC    Component Value Date/Time   WBC 7.4 06/29/2018 0247   RBC 3.79 (L) 06/29/2018 0247   HGB 11.6 (L) 06/29/2018 0247   HCT 34.9 (L) 06/29/2018 0247   PLT 237  06/29/2018 0247   MCV 92.1 06/29/2018 0247   MCH 30.6 06/29/2018 0247   MCHC 33.2 06/29/2018 0247   RDW 13.4 06/29/2018 0247     Chest imaging: 09/14/20 HRCT Chest Mediastinum/Nodes: No pathologically enlarged mediastinal or hilar lymph nodes. Please note that accurate exclusion of hilar adenopathy is limited on noncontrast CT scans. Esophagus is unremarkable in appearance. No axillary lymphadenopathy.   Lungs/Pleura: High-resolution images demonstrate patchy areas of peripheral predominant ground-glass attenuation, septal thickening, subpleural reticulation, parenchymal banding, cylindrical bronchiectasis and peripheral bronchiolectasis. No definitive honeycombing identified. Inspiratory and expiratory imaging is unremarkable. No acute consolidative airspace disease. No pleural effusions. Calcified granuloma in the right middle lobe. Previously noted 3 mm right upper lobe nodule has completely resolved. No definite suspicious appearing pulmonary nodules or masses are noted.  03/02/20 CT Chest: 1. Bilateral peripheral predominant patchy areas of ground-glass  attenuation, interstitial thickening and reticulation with mild  traction bronchiectasis. Findings compatible with interstitial lung  disease, favor hypersensitivity pneumonitis. Cannot rule out drug  toxicity. No frank honeycombing identified at this point. Recommend  follow-up imaging in 6 months with high-resolution CT of the chest  to assess for any temporal changes in the appearance of the lungs.  2. 3 mm right upper lobe lung nodule. No follow-up needed if patient  is low-risk. Non-contrast chest CT can be considered in 12 months if  patient is high-risk.  3. 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  4. Aortic atherosclerosis, in addition to 3 vessel coronary artery  disease. Please note that although the  presence of coronary artery  calcium documents the presence of coronary artery disease, the  severity of this disease and any potential stenosis cannot be  assessed on this non-gated CT examination. Assessment for potential  risk factor modification, dietary therapy or pharmacologic therapy  may be warranted, if clinically indicated.   CXR 03/27/20 Bilateral interstitial prominence again noted. Interstitial changes  are less prominent than noted on prior CT of 03/02/2020  CXR 04/15/20 Lungs are hypoinflated with  stable elevation of the left hemidiaphragm. There is interval  worsening of patchy bilateral hazy mixed interstitial airspace  density. Note that a patchy bilateral ground-glass process was noted  on the recent previous CT. Linear atelectasis over the left  perihilar region. No effusion. Cardiomediastinal silhouette is  normal. Remainder of the exam is unchanged.   PFT: 2022: FEV1/FVC 87%, FEV1 2.06L (80%), FVC 2.37L (66%) and DLCO 15.56 (69%)  2021: FEV1/FVC 88, FEV1 2.22L (86%), FVC 2.53L (70%), TLC 5.2L (82%), DLCO 14.99 (67%)   Labs: 06/21/20 CBC: WBC 47.9, HGB 12, HCT 38.2, Plt 895 06/21/20 BMP: Na 135, K 3.9, Cl 99, Co2 26.8, Cr 1.56, BUN 18   08/02/20 CBC: WBC 32.2, HGB 11.7, HCT 36.3, PLT 983 08/02/20 BMP: Na 133, K 4.6, Cl 95, Co2 29.7, Cr 1.72, BUN 8   Echo: 03/02/20   1. Technically difficult study due to chest wall/lung interference.    2. The left ventricle is normal in size with normal wall thickness.    3. The left ventricular systolic function is normal, LVEF is visually  estimated at 60-65%.    4. Visually there  is mild aortic valve stenosis.    5. The left atrium is mildly dilated in size.    6. The right ventricle is normal in size, with normal systolic function.   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  His dyspnea remains stable from last visit and PFTs today are reassuring that they are relatively stable from 2021.   We will repeat a high resolution CT chest in December for further follow up of his pneumonitis.   He will be weaning down on prednisone for his RA so we will continue to monitor hist respiratory symptoms as this occurs.   We will order him a nebulizer machine and duoneb nebulizer solution.  Follow up in 6 months.  Freda Jackson, MD [facility] Office: [phone]    Current Outpatient Medications:    acetaminophen (TYLENOL) 500 MG tablet, Take 1,000 mg by mouth every 6 (six) hours as needed for moderate pain., Disp: , Rfl:    aspirin EC 81 MG tablet, Take 162 mg by mouth daily. , Disp: , Rfl:    atorvastatin (LIPITOR) 10 MG tablet, Take 10 mg by mouth at bedtime. , Disp: , Rfl:    Cholecalciferol (VITAMIN D3) 5000 units TABS, Take 5,000 Units by mouth daily. , Disp: , Rfl:    fluconazole (DIFLUCAN) 150 MG tablet, Take 150 mg by mouth daily., Disp: , Rfl:    fluticasone (FLONASE) 50 MCG/ACT nasal spray, Place 2 sprays into both nostrils daily., Disp: , Rfl:    folic acid (FOLVITE) 1 MG tablet, Take 1 mg by mouth daily., Disp: , Rfl:    furosemide (LASIX) 20 MG tablet, Take 20 mg by mouth daily as needed for fluid., Disp: , Rfl:    gabapentin (NEURONTIN) 600 MG tablet, Take 600 mg by mouth 3 (three) times daily., Disp: , Rfl:    imatinib (GLEEVEC) 100 MG tablet, Take 300 mg by mouth daily at 6 (six) AM., Disp: , Rfl:    losartan-hydrochlorothiazide (HYZAAR) 100-12.5 MG tablet, Take 0.5 tablets by mouth daily.  (Patient taking differently: Take 1 tablet by mouth daily.), Disp: , Rfl:    magic mouthwash SOLN, Take 5 mLs by mouth daily as needed for mouth pain., Disp: , Rfl:    nitroGLYCERIN (NITROSTAT) 0.4 MG SL tablet, Place 0.4 mg under the tongue every 5 (five) minutes as needed for chest pain., Disp: , Rfl:    Omega-3 Fatty Acids (FISH OIL) 1000 MG CAPS, Take 2,000 mg by mouth 2 (two) times daily. , Disp: , Rfl:    ondansetron (ZOFRAN) 8 MG tablet, Take 8 mg by mouth every 8 (eight) hours as needed for vomiting or nausea., Disp: , Rfl:    pantoprazole (PROTONIX) 40 MG tablet, Take 40 mg by mouth daily., Disp: , Rfl:    polyethylene glycol (MIRALAX / GLYCOLAX) packet, Take 17 g by mouth daily., Disp: , Rfl:    predniSONE (DELTASONE) 5 MG tablet, Take 15 mg by mouth daily., Disp: , Rfl:    tamsulosin (FLOMAX) 0.4 MG CAPS capsule, Take 0.4  mg by mouth daily., Disp: , Rfl:    valACYclovir (VALTREX) 1000 MG tablet, Take 1,000 mg by mouth daily., Disp: , Rfl:

## 2021-05-24 NOTE — Progress Notes (Signed)
Spirometry and Dlco done today. 

## 2021-06-03 ENCOUNTER — Telehealth: Payer: Self-pay | Admitting: Pulmonary Disease

## 2021-06-03 MED ORDER — IPRATROPIUM-ALBUTEROL 0.5-2.5 (3) MG/3ML IN SOLN: 3.0000 mL | Freq: Four times a day (QID) | RESPIRATORY_TRACT | 0 refills | Status: DC | PRN

## 2021-06-03 NOTE — Telephone Encounter (Signed)
Spoke to patient, who stated that insurance will not cover nebulizer machine, therefore he will have to pay out of pocket.  He is questioning if duoneb is covered. Upon research it does not appear that Rx was sent to pharmacy. I have sent duoneb to [facility] has requested.  Patient is aware that he will ne notified by pharmacy if medication is not covered by insurance.  Nothing further needed at this time.

## 2021-06-10 ENCOUNTER — Telehealth: Payer: Self-pay | Admitting: Pulmonary Disease

## 2021-06-10 NOTE — Telephone Encounter (Signed)
Called and spoke to patient. Patient stated that he started albuterol solution on Friday. 15-20 after taking albuterol, he developed chest tightness, prod cough with clear, sore throat, wheezing and sob. Sx subside after 3-4hr. Last used albuterol yesterday.  Only lingering sx is a sore throat.  Denied f/c/s or additional sx.   Sarah, please advise. Dr. Erin Fulling is unavailable.

## 2021-06-10 NOTE — Telephone Encounter (Signed)
Patient is aware of below message and voiced his understanding. Patient is scheduled to see PCP today and will request EKG. Nothing further needed at this time.

## 2021-07-19 ENCOUNTER — Telehealth: Payer: Self-pay | Admitting: Cardiology

## 2021-07-19 NOTE — Telephone Encounter (Signed)
Patient called stating that his blood pressure seems to be going up and down. States that he is very fatigue (feeling like a limp dish cloth)   BP 143/58 530AM  BP  106/42   1030AM  [phone]

## 2021-07-19 NOTE — Telephone Encounter (Signed)
Patient calling with concerns of BP up & down, chest tightness, weakness and sob.  States that his SOB is mostly with exertion - ongoing x 2-3 weeks.  Chest tightness may be related to his past chemo - says it burned his lungs up.  Does have dizziness - could be anytime, but states this may be related to his Gabapentin.  Weight at last OV here was 181lb.  Weight 2 weeks ago at his pcp office was 190lb & weight today while on phone with patient was 196lb.  Stated that he has been using his Lasix 20mg  as needed - has taken one daily x the last 3 days.    BP readings:   10/3 - 139/67  97 10/4 - 140/61  92 10/5 - 152/69  90 10/6 - 160/70  91 10/7 - 143/58  90  Last stress test & echo was 2019 with [place].  Did have echo done at [facility] 02/2020 (can see in care everywhere).    Next OV scheduled for 08/26/21 with Dr. Harl Bowie [facility] office.

## 2021-07-22 NOTE — Telephone Encounter (Signed)
Left a message for pt to call back for appt with A. Leonides Sake, NP and medication change instructions.

## 2021-07-22 NOTE — Telephone Encounter (Signed)
Pt made appt w/ A. Leonides Sake, NP on 10/14. Pt states that he is not swelling in his hands/legs. Pt verbalized understanding of medication changes.

## 2021-07-22 NOTE — Telephone Encounter (Signed)
Can he get in to see Jonni Sanger this week or next. With the weight gain any swelling in the legs? If taking the lasix 20mg  daily and weights are not improving would increase to 40mg  prn and continue taking   Zandra Abts MD

## 2021-07-25 NOTE — Progress Notes (Signed)
Cardiology Office Note  Date: 07/28/2021   ID: Lonnie Duffy, Lonnie Duffy December 31, 1940, MRN [id number]  PCP:  Caryl Bis, MD  Cardiologist:  Carlyle Dolly, MD Electrophysiologist:  None   Chief Complaint: Swelling  History of Present Illness: Lonnie Duffy is a 80 y.o. male with a history of CAD, HTN, DOE, lower extremity swelling, COPD, DM 2, dyspnea, GERD, CML, ILD, dasatinib pneumonitis, palpitations, collagen vascular disease, RA.  Previous history of DES x2 to LAD 2019.  Indefinite DAPT recommended.  Echo May 2021 EF 60 to 65%.  Chronic stable DOE.  No recent chest pain.  Was compliant with medications.  He was on statin.  However atorvastatin doses caused muscle aches.  Labs were followed by PCP.  He was compliant with antihypertensive medications.  Aortic stenosis mild by echo May 2021: Mean gradient 9, AVA VTI 2.5.  He was considering hydrocelectomy.  He is here today for complaints of swelling. His primary complaint is related to perception of increased abdominal swelling. He denies any lower extremity edema. He states the Lasix he is taking does not seem to be working very well in managing the swelling. He states sometimes he does not seem to urinate very much when taking the Lasix. His weight on 05/24/2021 was 189. Today weight is 193.  There was a telephone encounter on 07/19/2021 with complaint of shortness of breath, continued swelling in abdomen, and labile blood pressures per patient. Dr Harl Bowie increased his prn Lasix to 40 mg.  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 He sees Dr Candie Echevaria oncology. He sees Dr. Erin Fulling pulmonology for his ILD. 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 He continued to have significant shortness of breath with activity and increased fatigue with activity per his note. He was seeing Rheumatology for his RA who was working on tapering his steroids. Recent labs on 07/04/2021 showed Crt of  1.74 and GFR of 40. He has not seen nephrology in the past. He states he recently had surgery for hydrocele.  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[place])    COPD (chronic obstructive pulmonary disease) ([place])    Coronary artery disease    Diabetes mellitus without complication ([place])    Dyspnea    GERD (gastroesophageal reflux disease)    Hypertension    Leukemia ([place])    Palpitations    Pneumonia 04/2020    Past Surgical History:  Procedure Laterality Date   APPENDECTOMY  1987   CARDIAC CATHETERIZATION  2019   CATARACT EXTRACTION  11/2009 & 12/2015   CORONARY STENT INTERVENTION  06/28/2018   CORONARY STENT INTERVENTION N/A 06/28/2018   Procedure: CORONARY STENT INTERVENTION;  Surgeon: Nelva Bush, MD;  Location: [facility];  Service: Cardiovascular;  Laterality: N/A;   GALLBLADDER SURGERY  1989   HYDROCELE EXCISION Right 03/18/2021   Procedure: HYDROCELECTOMY ADULT;  Surgeon: Cleon Gustin, MD;  Location: [facility];  Service: Urology;  Laterality: Right;   INTRAVASCULAR ULTRASOUND/IVUS N/A 06/28/2018   Procedure: Intravascular Ultrasound/IVUS;  Surgeon: Nelva Bush, MD;  Location: [place] [facility];  Service: Cardiovascular;  Laterality: N/A;   NASAL SINUS SURGERY  11/21/1987   RIGHT/LEFT HEART  CATH AND CORONARY ANGIOGRAPHY N/A 06/28/2018   Procedure: RIGHT/LEFT HEART CATH AND CORONARY ANGIOGRAPHY;  Surgeon: Nelva Bush, MD;  Location: [facility];  Service: Cardiovascular;  Laterality: N/A;   SHOULDER SURGERY  2007   stent in lad  06/07/2004   TONSILLECTOMY  1971   trigerfinger  1990    Current Outpatient Medications  Medication Sig Dispense Refill   acetaminophen (TYLENOL) 500 MG tablet Take 1,000 mg by mouth every 6 (six) hours as  needed for moderate pain.     aspirin EC 81 MG tablet Take 81 mg by mouth 2 (two) times daily.     atorvastatin (LIPITOR) 10 MG tablet Take 10 mg by mouth at bedtime.      carbamide peroxide (DEBROX) 6.5 % OTIC solution Place 5 drops into both ears 2 (two) times daily.     Cholecalciferol (VITAMIN D3) 5000 units TABS Take 5,000 Units by mouth daily.      clopidogrel (PLAVIX) 75 MG tablet Take 75 mg by mouth daily.     cyanocobalamin 1000 MCG tablet Take 3,000 mcg by mouth daily.     cyclobenzaprine (FLEXERIL) 10 MG tablet Take 10 mg by mouth 2 (two) times daily.     ferrous sulfate 325 (65 FE) MG tablet Take 1 tablet by mouth daily.     fluconazole (DIFLUCAN) 150 MG tablet Take 150 mg by mouth daily.     fluticasone (FLONASE) 50 MCG/ACT nasal spray Place 2 sprays into both nostrils daily.     folic acid (FOLVITE) 1 MG tablet Take 1 mg by mouth daily.     furosemide (LASIX) 20 MG tablet Take 20 mg by mouth daily as needed for fluid.     gabapentin (NEURONTIN) 600 MG tablet Take 600 mg by mouth 3 (three) times daily.     imatinib (GLEEVEC) 100 MG tablet Take 300 mg by mouth daily at 6 (six) AM.     levocetirizine (XYZAL) 5 MG tablet Take 5 mg by mouth at bedtime.     losartan-hydrochlorothiazide (HYZAAR) 100-12.5 MG tablet Take 0.5 tablets by mouth daily.     magic mouthwash SOLN Take 5 mLs by mouth daily as needed for mouth pain.     nitroGLYCERIN (NITROSTAT) 0.4 MG SL tablet Place 0.4 mg under the tongue every 5 (five) minutes as needed for chest pain.     Omega-3 Fatty Acids (FISH OIL) 1000 MG CAPS Take 2,000 mg by mouth 2 (two) times daily.      ondansetron (ZOFRAN) 8 MG tablet Take 8 mg by mouth every 8 (eight) hours as needed for vomiting or nausea.     pantoprazole (PROTONIX) 40 MG tablet Take 40 mg by mouth daily.     PHOS-NAK 280-160-250 MG PACK Take 1 packet by mouth 2 (two) times daily.     polyethylene glycol (MIRALAX / GLYCOLAX) packet Take 17 g by mouth daily.     SM ATHLETES FOOT  1 % cream Apply 1 application topically 2 (two) times daily. to affected area(s)     tamsulosin (FLOMAX) 0.4 MG CAPS capsule Take 0.4 mg by mouth daily.     valACYclovir (VALTREX) 1000 MG tablet Take 1,000 mg by mouth daily.     ipratropium-albuterol (DUONEB) 0.5-2.5 (3) MG/3ML SOLN Take 3 mLs by nebulization every 6 (six) hours as needed. (Patient not taking: Reported on 07/26/2021) 1080 mL 0   No current facility-administered medications for this visit.   Allergies:  Oxycodone, Eggs or egg-derived products, Flagyl [metronidazole],  and Penicillins   Social History: The patient  reports that he has never smoked. He has never used smokeless tobacco. He reports that he does not drink alcohol and does not use drugs.   Family History: The patient's family history includes Diabetes in his father; Heart attack in his father and mother.   ROS:  Please see the history of present illness. Otherwise, complete review of systems is positive for none.  All other systems are reviewed and negative.   Physical Exam: VS:  BP (!) 140/56   Pulse 88   Ht 5\' 7"  (1.702 m)   Wt 193 lb 12.8 oz (87.9 kg)   SpO2 96%   BMI 30.35 kg/m , BMI Body mass index is 30.35 kg/m.  Wt Readings from Last 3 Encounters:  07/26/21 193 lb 12.8 oz (87.9 kg)  05/24/21 189 lb 9.6 oz (86 kg)  03/15/21 155 lb 1.9 oz (70.4 kg)    General: Patient appears comfortable at rest. Neck: Supple, no elevated JVP or carotid bruits, no thyromegaly. Lungs: Clear to auscultation, nonlabored breathing at rest. Cardiac: Regular rate and rhythm, no S3 or significant systolic murmur, no pericardial rub. Abdomen: Slightly tight to palpation, nontender, no hepatomegaly, bowel sounds present, no guarding or rebound. Extremities: No pitting edema, distal pulses 2+. Skin: Warm and dry. Musculoskeletal: No kyphosis. Neuropsychiatric: Alert and oriented x3, affect grossly appropriate.  ECG:    Recent Labwork: 03/15/2021: BUN 16; Creatinine, Ser  1.19; Potassium 4.1; Sodium 129     Component Value Date/Time   CHOL 115 06/29/2018 0247   TRIG 121 06/29/2018 0247   HDL 35 (L) 06/29/2018 0247   CHOLHDL 3.3 06/29/2018 0247   VLDL 24 06/29/2018 0247   LDLCALC 56 06/29/2018 0247    Other Studies Reviewed Today:   Diagnostic Studies 02/2020 echo [facility] Summary    1. Technically difficult study due to chest wall/lung interference.    2. The left ventricle is normal in size with normal wall thickness.    3. The left ventricular systolic function is normal, LVEF is visually  estimated at 60-65%.    4. Visually there is mild aortic valve stenosis.    5. The left atrium is mildly dilated in size.    6. The right ventricle is normal in size, with normal systolic function.    06/2018 cath Conclusions: Severe single-vessel coronary artery involving proximal and distal edges/segments of previously placed proximal LAD stent. Mild, non-obstructive CAD involving mid RCA. Normal left heart, right heart, and pulmonary artery pressures. Normal left ventricular systolic function and Fick cardiac output. Successful IVUS-guided PCI to proximal and mid LAD with placement of Synergy 2.5 x 16 mm and Synergy 2.25 x 20 mm drug-eluting stents.  Both stents overlap the previously placed Taxus stent in the proximal LAD.  Final angiogram shows 0% residual stenosis with TIMI-3 flow.   Recommendations: Aggressive secondary prevention. Overnight extended recovery due to significant right arm/shoulder pain during and after catheterization secondary to chronic arthritis.        Assessment and Plan:  1. Abdominal swelling   2. CAD in native artery   3. Mixed hyperlipidemia   4. Essential hypertension   5. Aortic valve stenosis, etiology of cardiac valve disease unspecified   6. Renal dysfunction   7. SOB (shortness of breath)    1. Abdominal swelling Complaining of abdominal swelling.  No evidence of LE edema on exam today. His abdomen does seem  somewhat taught on exam. He has no known  liver disease or HF history. His weight has increased some since last visit from 189 weight in August to current 193 today. He has baseline dyspnea due to ILD. He states he feels more SOB recently. He had mild AS on previous echo last year at [facility]. His recent renal function labs show Crt 1.74 and GFR 40.   Get repeat echocardiogram for complaints of abdominal swelling, weight gain, SOB/DOE, history of AS.  2. CAD in native artery  Previous cardiac catheterization 06/2018 single vessel CAD involving proximal and distal edge / segments of previously placed proximal LAD stent. Mild non-obstructive CAD involving mid RCA. He had sucessful IVUS guided PCI to proximal and mid LAD with 2 DES. No current anginal symptoms. Continue Plavix 75 mg po daily, ASA 81 mg po daily, SL NTG prn.  3. Mixed hyperlipidemia Continue atorvastatin 10 mg po daily, Omega 3 fatty acids 2000 mg po bid.  4. Essential hypertension BP elevated today. Patient states BP has been fluctuating recently. BP today 140/56. Continue Hyzaar 100/12.5 mg po daily. May need to adjust or add medication at follow up if continued elevation.   5. Aortic valve stenosis, etiology of cardiac valve disease unspecified Previous echocardi[facility] at [facility] last year 02/2020 showed AS mild  6. Renal dysfunction Recent labs with Crt 1.74 and GFR 40 [facility] 07/04/2021. Please refer to Nephology Dr Theador Hawthorne.   7.Shortness of breath / Dyspnea Patient complains of increased SOB along with abdominal swelling, weight gain. History of mild AS on last echocardiogram. 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 Please get an echocardiogram  Medication Adjustments/Labs and Tests Ordered: Current medicines are reviewed at length with the patient today.  Concerns regarding medicines are outlined above.   Disposition: Follow-up with Dr Harl Bowie as planned  Signed, Levell July, NP 07/28/2021 7:36 AM    [facility] [address], [place], [place] [address] Phone: [phone]; Fax: [phone]

## 2021-07-26 ENCOUNTER — Other Ambulatory Visit: Payer: Self-pay

## 2021-07-26 ENCOUNTER — Encounter: Payer: Self-pay | Admitting: Family Medicine

## 2021-07-26 ENCOUNTER — Ambulatory Visit: Payer: Medicare Other | Admitting: Family Medicine

## 2021-07-26 VITALS — BP 140/56 | HR 88 | Ht 67.0 in | Wt 193.8 lb

## 2021-07-26 DIAGNOSIS — R600 Localized edema: Secondary | ICD-10-CM

## 2021-07-26 DIAGNOSIS — I10 Essential (primary) hypertension: Secondary | ICD-10-CM

## 2021-07-26 DIAGNOSIS — E782 Mixed hyperlipidemia: Secondary | ICD-10-CM

## 2021-07-26 DIAGNOSIS — I350 Nonrheumatic aortic (valve) stenosis: Secondary | ICD-10-CM

## 2021-07-26 DIAGNOSIS — I2510 Atherosclerotic heart disease of native coronary artery without angina pectoris: Secondary | ICD-10-CM | POA: Diagnosis not present

## 2021-07-26 DIAGNOSIS — N289 Disorder of kidney and ureter, unspecified: Secondary | ICD-10-CM

## 2021-07-26 DIAGNOSIS — R1900 Intra-abdominal and pelvic swelling, mass and lump, unspecified site: Secondary | ICD-10-CM

## 2021-07-26 DIAGNOSIS — R0602 Shortness of breath: Secondary | ICD-10-CM

## 2021-07-26 NOTE — Patient Instructions (Addendum)
Medication Instructions:  Your physician recommends that you continue on your current medications as directed. Please refer to the Current Medication list given to you today.  Labwork: None  Testing/Procedures: Your physician has requested that you have an echocardiogram. Echocardiography is a painless test that uses sound waves to create images of your heart. It provides your doctor with information about the size and shape of your heart and how well your heart's chambers and valves are working. This procedure takes approximately one hour. There are no restrictions for this procedure.  Follow-Up: Your physician recommends that you schedule a follow-up appointment in: as planned  Any Other Special Instructions Will Be Listed Below (If Applicable). You have been referred to a nephrologist  If you need a refill on your cardiac medications before your next appointment, please call your pharmacy.

## 2021-08-04 ENCOUNTER — Other Ambulatory Visit: Payer: Self-pay | Admitting: Pulmonary Disease

## 2021-08-12 ENCOUNTER — Other Ambulatory Visit: Payer: Self-pay

## 2021-08-12 ENCOUNTER — Ambulatory Visit (INDEPENDENT_AMBULATORY_CARE_PROVIDER_SITE_OTHER): Payer: Medicare Other

## 2021-08-12 DIAGNOSIS — R1900 Intra-abdominal and pelvic swelling, mass and lump, unspecified site: Secondary | ICD-10-CM

## 2021-08-12 DIAGNOSIS — R0602 Shortness of breath: Secondary | ICD-10-CM | POA: Diagnosis not present

## 2021-08-13 LAB — ECHOCARDIOGRAM COMPLETE
AR max vel: 1.33 cm2
AV Area VTI: 1.36 cm2
AV Area mean vel: 1.36 cm2
AV Mean grad: 14 mmHg
AV Peak grad: 25.2 mmHg
Ao pk vel: 2.51 m/s
Area-P 1/2: 2.04 cm2
MV VTI: 1.35 cm2
S' Lateral: 1.91 cm
Single Plane A4C EF: 57.5 %

## 2021-08-14 ENCOUNTER — Telehealth: Payer: Self-pay | Admitting: *Deleted

## 2021-08-14 NOTE — Telephone Encounter (Signed)
Laurine Blazer, LPN  93/02/7016  7:93 PM EDT Back to Top    Notified, copy to pcp.    Laurine Blazer, LPN  90/12/90  3:30 PM EDT     Left message to return call.   Verta Ellen., NP  08/13/2021  2:11 PM EDT     Please call the patient and let him know the echocardiogram showed he has good pumping function of the heart.  The main pumping chamber is a little stiff and has problems relaxing.  The best management is to keep blood pressure at or below 130/80 consistently and manage all other risk factors.  He had a very trivial mitral valve leak which is age-related.  He has mild to moderate aortic valve stenosis.  Aortic stenosis can cause some degree of shortness of breath especially with activity.  We will perform annual echocardiograms to monitor the aortic stenosis.    Verta Ellen, NP  08/13/2021 2:07 PM

## 2021-08-26 ENCOUNTER — Other Ambulatory Visit: Payer: Self-pay | Admitting: *Deleted

## 2021-08-26 ENCOUNTER — Encounter: Payer: Self-pay | Admitting: *Deleted

## 2021-08-26 ENCOUNTER — Encounter: Payer: Self-pay | Admitting: Cardiology

## 2021-08-26 ENCOUNTER — Ambulatory Visit: Payer: Medicare Other | Admitting: Cardiology

## 2021-08-26 ENCOUNTER — Other Ambulatory Visit: Payer: Self-pay

## 2021-08-26 ENCOUNTER — Telehealth: Payer: Self-pay | Admitting: Cardiology

## 2021-08-26 VITALS — BP 132/70 | HR 104 | Ht 67.0 in | Wt 183.6 lb

## 2021-08-26 DIAGNOSIS — I10 Essential (primary) hypertension: Secondary | ICD-10-CM | POA: Diagnosis not present

## 2021-08-26 DIAGNOSIS — I2510 Atherosclerotic heart disease of native coronary artery without angina pectoris: Secondary | ICD-10-CM | POA: Diagnosis not present

## 2021-08-26 DIAGNOSIS — R079 Chest pain, unspecified: Secondary | ICD-10-CM | POA: Diagnosis not present

## 2021-08-26 NOTE — Patient Instructions (Signed)
Medication Instructions:  Continue all current medications.  Labwork: none  Testing/Procedures:  Your physician has requested that you have a lexiscan myoview. For further information please visit [email or web address]. Please follow instruction sheet, as given.  Office will contact with results via phone or letter.    Follow-Up: 3 months   Any Other Special Instructions Will Be Listed Below (If Applicable).  If you need a refill on your cardiac medications before your next appointment, please call your pharmacy.  

## 2021-08-26 NOTE — Telephone Encounter (Signed)
Checking percert on the following patient for testing scheduled at [facility].     LEXISCAN   09/02/2021

## 2021-08-26 NOTE — Progress Notes (Signed)
Clinical Summary Lonnie Duffy is a 80 y.o.male seen today for follow up of the following medical problems.    1. CAD - history of DES x 2 to LAD in 2019 - from notes interventional had recommended indefinite DAPT - 02/2020 echo LVEF 60-65%   - no recent chest pain. Chronic DOE that is stable - walks daily up 15 minutes, tolerates without troubles. Walks up flight of stairs without any exertional symptoms.  - compliant with meds  -some recent chest pains - with mild activity, significant fatigue. +SOB, some chest discomfort. Dull/tightness midchest, 6/10 in severity. Discomfort lasts few minutes, resolves with rest - has taken NG 2 times, improved with NG  - cannot walk on treadmill due joint pains     2. Hyperlipidemia - Higher atorvastatin doses causes muscle aches - labs follwoed bypcp   3. 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 Aortic stenosis - very mild by 02/2020 echo. Mean grad 9, AVA 1.36, VTI 2.5  07/2021 echo LVEF 65-70%. Mild to mod AS mean grad 14, AVA VTI, DI 0.54, SVI 27     6. Chronic diastolic HF - 05/1447 echo LVEF 65-70%, grade I DD - recent visit was reporting some abdominal distension  - takes lasix just prn, has not needed recently.  - had been on prednsione at time of abdominal distension, had significant weight gain. Down 10 lbs since off prednisone.   7. 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[place])    COPD (chronic obstructive pulmonary disease) ([place])    Coronary artery disease    Diabetes mellitus without complication (HCC)    Dyspnea    GERD (gastroesophageal reflux disease)     Hypertension    Leukemia (HCC)    Palpitations    Pneumonia 04/2020     Allergies  Allergen Reactions   Oxycodone Shortness Of Breath   Eggs Or Egg-Derived Products     Drainage in the back of throat   Flagyl [Metronidazole]     Couldn't urinate   Penicillins Rash    Has patient had a PCN reaction causing immediate rash, facial/tongue/throat swelling, SOB or lightheadedness with hypotension: Yes Has patient had a PCN reaction causing severe rash involving mucus membranes or skin necrosis: No Has patient had a PCN reaction that required hospitalization: No Has patient had a PCN reaction occurring within the last 10 years: No If all of the above answers are "NO", then may proceed with Cephalosporin use.'     Current Outpatient Medications  Medication Sig Dispense Refill   acetaminophen (TYLENOL) 500 MG tablet Take 1,000 mg by mouth every 6 (six) hours as needed for moderate pain.     aspirin EC 81 MG tablet Take 81 mg by mouth 2 (two) times daily.     atorvastatin (LIPITOR) 10 MG tablet Take 10 mg by mouth at bedtime.      carbamide peroxide (DEBROX) 6.5 % OTIC solution Place 5 drops into both ears 2 (two) times daily.     Cholecalciferol (VITAMIN D3) 5000 units TABS Take 5,000  Units by mouth daily.      clopidogrel (PLAVIX) 75 MG tablet Take 75 mg by mouth daily.     cyanocobalamin 1000 MCG tablet Take 3,000 mcg by mouth daily.     cyclobenzaprine (FLEXERIL) 10 MG tablet Take 10 mg by mouth 2 (two) times daily.     ferrous sulfate 325 (65 FE) MG tablet Take 1 tablet by mouth daily.     fluconazole (DIFLUCAN) 150 MG tablet Take 150 mg by mouth daily.     fluticasone (FLONASE) 50 MCG/ACT nasal spray Place 2 sprays into both nostrils daily.     folic acid (FOLVITE) 1 MG tablet Take 1 mg by mouth daily.     furosemide (LASIX) 20 MG tablet Take 20 mg by mouth daily as needed for fluid.     gabapentin (NEURONTIN) 600 MG tablet Take 600 mg by mouth 3 (three) times daily.     imatinib  (GLEEVEC) 100 MG tablet Take 300 mg by mouth daily at 6 (six) AM.     ipratropium-albuterol (DUONEB) 0.5-2.5 (3) MG/3ML SOLN Take 3 mLs by nebulization every 6 (six) hours as needed. (Patient not taking: Reported on 07/26/2021) 1080 mL 0   levocetirizine (XYZAL) 5 MG tablet Take 5 mg by mouth at bedtime.     losartan-hydrochlorothiazide (HYZAAR) 100-12.5 MG tablet Take 0.5 tablets by mouth daily.     magic mouthwash SOLN Take 5 mLs by mouth daily as needed for mouth pain.     nitroGLYCERIN (NITROSTAT) 0.4 MG SL tablet Place 0.4 mg under the tongue every 5 (five) minutes as needed for chest pain.     Omega-3 Fatty Acids (FISH OIL) 1000 MG CAPS Take 2,000 mg by mouth 2 (two) times daily.      ondansetron (ZOFRAN) 8 MG tablet Take 8 mg by mouth every 8 (eight) hours as needed for vomiting or nausea.     pantoprazole (PROTONIX) 40 MG tablet Take 40 mg by mouth daily.     PHOS-NAK 280-160-250 MG PACK Take 1 packet by mouth 2 (two) times daily.     polyethylene glycol (MIRALAX / GLYCOLAX) packet Take 17 g by mouth daily.     SM ATHLETES FOOT 1 % cream Apply 1 application topically 2 (two) times daily. to affected area(s)     tamsulosin (FLOMAX) 0.4 MG CAPS capsule Take 0.4 mg by mouth daily.     valACYclovir (VALTREX) 1000 MG tablet Take 1,000 mg by mouth daily.     No current facility-administered medications for this visit.     Past Surgical History:  Procedure Laterality Date   APPENDECTOMY  1987   CARDIAC CATHETERIZATION  2019   CATARACT EXTRACTION  11/2009 & 12/2015   CORONARY STENT INTERVENTION  06/28/2018   CORONARY STENT INTERVENTION N/A 06/28/2018   Procedure: CORONARY STENT INTERVENTION;  Surgeon: Nelva Bush, MD;  Location: [facility];  Service: Cardiovascular;  Laterality: N/A;   GALLBLADDER SURGERY  1989   HYDROCELE EXCISION Right 03/18/2021   Procedure: HYDROCELECTOMY ADULT;  Surgeon: Cleon Gustin, MD;  Location: [facility];  Service: Urology;  Laterality: Right;    INTRAVASCULAR ULTRASOUND/IVUS N/A 06/28/2018   Procedure: Intravascular Ultrasound/IVUS;  Surgeon: Nelva Bush, MD;  Location: [facility];  Service: Cardiovascular;  Laterality: N/A;   NASAL SINUS SURGERY  11/21/1987   RIGHT/LEFT HEART CATH AND CORONARY ANGIOGRAPHY N/A 06/28/2018   Procedure: RIGHT/LEFT HEART CATH AND CORONARY ANGIOGRAPHY;  Surgeon: Nelva Bush, MD;  Location: [facility];  Service: Cardiovascular;  Laterality: N/A;   SHOULDER SURGERY  2007   stent in lad  06/07/2004   TONSILLECTOMY  1971   trigerfinger  1990     Allergies  Allergen Reactions   Oxycodone Shortness Of Breath   Eggs Or Egg-Derived Products     Drainage in the back of throat   Flagyl [Metronidazole]     Couldn't urinate   Penicillins Rash    Has patient had a PCN reaction causing immediate rash, facial/tongue/throat swelling, SOB or lightheadedness with hypotension: Yes Has patient had a PCN reaction causing severe rash involving mucus membranes or skin necrosis: No Has patient had a PCN reaction that required hospitalization: No Has patient had a PCN reaction occurring within the last 10 years: No If all of the above answers are "NO", then may proceed with Cephalosporin use.'      Family History  Problem Relation Age of Onset   Heart attack Mother    Heart attack Father    Diabetes Father      Social History Lonnie Duffy reports that he has never smoked. He has never used smokeless tobacco. Lonnie Duffy reports no history of alcohol use.   Review of Systems CONSTITUTIONAL: No weight loss, fever, chills, weakness or fatigue.  HEENT: Eyes: No visual loss, blurred vision, double vision or yellow sclerae.No hearing loss, sneezing, congestion, runny nose or sore throat.  SKIN: No rash or itching.  CARDIOVASCULAR: per hpi RESPIRATORY: No shortness of breath, cough or sputum.  GASTROINTESTINAL: No anorexia, nausea, vomiting or diarrhea. No abdominal pain or blood.   GENITOURINARY: No burning on urination, no polyuria NEUROLOGICAL: No headache, dizziness, syncope, paralysis, ataxia, numbness or tingling in the extremities. No change in bowel or bladder control.  MUSCULOSKELETAL: No muscle, back pain, joint pain or stiffness.  LYMPHATICS: No enlarged nodes. No history of splenectomy.  PSYCHIATRIC: No history of depression or anxiety.  ENDOCRINOLOGIC: No reports of sweating, cold or heat intolerance. No polyuria or polydipsia.  Marland Kitchen   Physical Examination Today's Vitals   08/26/21 0947  BP: 132/70  Pulse: (!) 104  SpO2: 98%  Weight: 183 lb 9.6 oz (83.3 kg)  Height: 5\' 7"  (1.702 m)   Body mass index is 28.76 kg/m.  Gen: resting comfortably, no acute distress HEENT: no scleral icterus, pupils equal round and reactive, no palptable cervical adenopathy,  CV: RRR, 3/6 systolic murmur rusb, no jvd Resp: Clear to auscultation bilaterally GI: abdomen is soft, non-tender, non-distended, normal bowel sounds, no hepatosplenomegaly MSK: extremities are warm, no edema.  Skin: warm, no rash Neuro:  no focal deficits Psych: appropriate affect   Diagnostic Studies  02/2020 echo [facility] Summary    1. Technically difficult study due to chest wall/lung interference.    2. The left ventricle is normal in size with normal wall thickness.    3. The left ventricular systolic function is normal, LVEF is visually  estimated at 60-65%.    4. Visually there is mild aortic valve stenosis.    5. The left atrium is mildly dilated in size.    6. The right ventricle is normal in size, with normal systolic function.    06/2018 cath Conclusions: Severe single-vessel coronary artery involving proximal and distal edges/segments of previously placed proximal LAD stent. Mild, non-obstructive CAD involving mid RCA. Normal left heart, right heart, and pulmonary artery pressures. Normal left ventricular systolic function and Fick cardiac output. Successful IVUS-guided PCI  to proximal and mid LAD with placement of Synergy 2.5  x 16 mm and Synergy 2.25 x 20 mm drug-eluting stents.  Both stents overlap the previously placed Taxus stent in the proximal LAD.  Final angiogram shows 0% residual stenosis with TIMI-3 flow.   Recommendations: Aggressive secondary prevention. Overnight extended recovery due to significant right arm/shoulder pain during and after catheterization secondary to chronic arthritis.   02/2020 echo [facility] Summary    1. Technically difficult study due to chest wall/lung interference.    2. The left ventricle is normal in size with normal wall thickness.    3. The left ventricular systolic function is normal, LVEF is visually  estimated at 60-65%.    4. Visually there is mild aortic valve stenosis.    5. The left atrium is mildly dilated in size.    6. The right ventricle is normal in size, with normal systolic function.      07/2021 echo IMPRESSIONS     1. Left ventricular ejection fraction, by estimation, is 65 to 70%. The  left ventricle has normal function. The left ventricle has no regional  wall motion abnormalities. Left ventricular diastolic parameters are  consistent with Grade I diastolic  dysfunction (impaired relaxation).   2. Right ventricular systolic function is normal. The right ventricular  size is normal. Tricuspid regurgitation signal is inadequate for assessing  PA pressure.   3. The mitral valve is abnormal. Trivial mitral valve regurgitation.   4. The aortic valve is tricuspid with restricted motion of the left  coronary cusp. There is moderate calcification of the aortic valve. Aortic  valve regurgitation is not visualized. Mild to moderate aortic valve  stenosis. Aortic valve mean gradient  measures 14.0 mmHg. Dimentionless index 0.54.   5. The inferior vena cava is dilated in size with >50% respiratory  variability, suggesting right atrial pressure of 8 mmHg.     Assessment and Plan  1. CAD  - has been  committed to indefinitie DAPT - recent exertional DOE and chest pains - echo with normal LVEF, just mild to moderate AS - EKG today shows SR, new inferior mild ST depression - will obtain lexiscan to further evaluate.     2. HTN - at goal, continue current meds   3. Hyperlipidemia -  troubles tolerating higher dose - continue atorvastatin  4. Aortic stenosis - mild to moderate by recent US, repeat US 1 year       Arnoldo Lenis, M.D.

## 2021-09-02 ENCOUNTER — Encounter (HOSPITAL_COMMUNITY): Payer: Self-pay

## 2021-09-02 ENCOUNTER — Encounter (HOSPITAL_COMMUNITY)
Admission: RE | Admit: 2021-09-02 | Discharge: 2021-09-02 | Disposition: A | Discharge status: Home / Self Care | Payer: Medicare Other | Source: Ambulatory Visit | Attending: Cardiology | Admitting: Cardiology

## 2021-09-02 ENCOUNTER — Ambulatory Visit (HOSPITAL_COMMUNITY): Admission: RE | Admit: 2021-09-02 | Payer: Medicare Other | Source: Ambulatory Visit

## 2021-09-02 ENCOUNTER — Other Ambulatory Visit: Payer: Self-pay

## 2021-09-02 DIAGNOSIS — R079 Chest pain, unspecified: Secondary | ICD-10-CM | POA: Insufficient documentation

## 2021-09-04 ENCOUNTER — Other Ambulatory Visit (HOSPITAL_COMMUNITY): Payer: Medicare Other

## 2021-09-10 ENCOUNTER — Other Ambulatory Visit (HOSPITAL_COMMUNITY): Payer: Self-pay | Admitting: Nephrology

## 2021-09-10 ENCOUNTER — Other Ambulatory Visit: Payer: Self-pay | Admitting: Nephrology

## 2021-09-10 DIAGNOSIS — E1122 Type 2 diabetes mellitus with diabetic chronic kidney disease: Secondary | ICD-10-CM

## 2021-09-10 DIAGNOSIS — I129 Hypertensive chronic kidney disease with stage 1 through stage 4 chronic kidney disease, or unspecified chronic kidney disease: Secondary | ICD-10-CM

## 2021-09-12 ENCOUNTER — Other Ambulatory Visit: Payer: Self-pay

## 2021-09-12 ENCOUNTER — Encounter (HOSPITAL_COMMUNITY): Payer: Self-pay

## 2021-09-12 ENCOUNTER — Ambulatory Visit (HOSPITAL_COMMUNITY)
Admission: RE | Admit: 2021-09-12 | Discharge: 2021-09-12 | Disposition: A | Discharge status: Home / Self Care | Payer: Medicare Other | Source: Ambulatory Visit | Attending: Cardiology | Admitting: Cardiology

## 2021-09-12 ENCOUNTER — Encounter (HOSPITAL_COMMUNITY)
Admission: RE | Admit: 2021-09-12 | Discharge: 2021-09-12 | Disposition: A | Discharge status: Home / Self Care | Payer: Medicare Other | Source: Ambulatory Visit | Attending: Cardiology | Admitting: Cardiology

## 2021-09-12 DIAGNOSIS — R079 Chest pain, unspecified: Secondary | ICD-10-CM

## 2021-09-12 DIAGNOSIS — I2510 Atherosclerotic heart disease of native coronary artery without angina pectoris: Secondary | ICD-10-CM | POA: Diagnosis not present

## 2021-09-12 DIAGNOSIS — R0602 Shortness of breath: Secondary | ICD-10-CM | POA: Diagnosis not present

## 2021-09-12 HISTORY — DX: Disorder of kidney and ureter, unspecified: N28.9

## 2021-09-12 HISTORY — DX: Unspecified asthma, uncomplicated: J45.909

## 2021-09-12 LAB — NM MYOCAR MULTI W/SPECT W/WALL MOTION / EF
LV dias vol: 54 mL (ref 62–150)
LV sys vol: 11 mL
Nuc Stress EF: 79 %
Peak HR: 109 {beats}/min
RATE: 0.4
Rest HR: 88 {beats}/min
Rest Nuclear Isotope Dose: 10.5 mCi
SDS: 1
SRS: 0
SSS: 1
ST Depression (mm): 0 mm
Stress Nuclear Isotope Dose: 31 mCi
TID: 0.76

## 2021-09-12 MED ORDER — REGADENOSON 0.4 MG/5ML IV SOLN
INTRAVENOUS | Status: AC
  Administered 2021-09-12: 0.4 mg via INTRAVENOUS
  Filled 2021-09-12: qty 5

## 2021-09-12 MED ORDER — TECHNETIUM TC 99M TETROFOSMIN IV KIT
30.0000 | PACK | Freq: Once | INTRAVENOUS | Status: AC | PRN
  Administered 2021-09-12: 31 via INTRAVENOUS

## 2021-09-12 MED ORDER — SODIUM CHLORIDE FLUSH 0.9 % IV SOLN
INTRAVENOUS | Status: AC
  Administered 2021-09-12: 10 mL via INTRAVENOUS
  Filled 2021-09-12: qty 10

## 2021-09-12 MED ORDER — TECHNETIUM TC 99M TETROFOSMIN IV KIT
10.0000 | PACK | Freq: Once | INTRAVENOUS | Status: AC | PRN
  Administered 2021-09-12: 10.5 via INTRAVENOUS

## 2021-09-19 ENCOUNTER — Telehealth: Payer: Self-pay | Admitting: Cardiology

## 2021-09-19 NOTE — Telephone Encounter (Signed)
Patient is calling for his stress test results.

## 2021-09-20 NOTE — Telephone Encounter (Signed)
Pt is calling to f.u

## 2021-09-20 NOTE — Telephone Encounter (Signed)
Just resulted   J Abby Stines MD 

## 2021-09-23 ENCOUNTER — Telehealth: Payer: Self-pay | Admitting: *Deleted

## 2021-09-23 NOTE — Telephone Encounter (Signed)
Laurine Blazer, LPN  [id number]  1:99 PM EST Back to Top    Notified, copy to pcp.    Laurine Blazer, LPN  [id number]/44/[id number]  3:50 AM EST     Left message to return call.

## 2021-09-23 NOTE — Telephone Encounter (Signed)
-----   Message from Arnoldo Lenis, MD sent at 09/20/2021  2:29 PM EST ----- Normal stress test, no evidence of any blockages in the heart  Zandra Abts MD

## 2021-10-02 ENCOUNTER — Other Ambulatory Visit: Payer: Self-pay

## 2021-10-02 ENCOUNTER — Ambulatory Visit (HOSPITAL_COMMUNITY)
Admission: RE | Admit: 2021-10-02 | Discharge: 2021-10-02 | Disposition: A | Discharge status: Home / Self Care | Payer: Medicare Other | Source: Ambulatory Visit | Attending: Nephrology | Admitting: Nephrology

## 2021-10-02 DIAGNOSIS — I129 Hypertensive chronic kidney disease with stage 1 through stage 4 chronic kidney disease, or unspecified chronic kidney disease: Secondary | ICD-10-CM

## 2021-10-02 DIAGNOSIS — E1122 Type 2 diabetes mellitus with diabetic chronic kidney disease: Secondary | ICD-10-CM | POA: Diagnosis present

## 2021-10-18 ENCOUNTER — Other Ambulatory Visit: Payer: Self-pay

## 2021-10-18 ENCOUNTER — Ambulatory Visit (HOSPITAL_COMMUNITY)
Admission: RE | Admit: 2021-10-18 | Discharge: 2021-10-18 | Disposition: A | Discharge status: Home / Self Care | Payer: Medicare Other | Source: Ambulatory Visit | Attending: Pulmonary Disease | Admitting: Pulmonary Disease

## 2021-10-18 DIAGNOSIS — J849 Interstitial pulmonary disease, unspecified: Secondary | ICD-10-CM | POA: Diagnosis present

## 2021-10-21 ENCOUNTER — Ambulatory Visit: Payer: Medicare Other | Admitting: Pulmonary Disease

## 2021-10-21 ENCOUNTER — Encounter: Payer: Self-pay | Admitting: Pulmonary Disease

## 2021-10-21 ENCOUNTER — Other Ambulatory Visit: Payer: Self-pay

## 2021-10-21 VITALS — BP 126/62 | HR 104 | Ht 67.0 in | Wt 175.0 lb

## 2021-10-21 DIAGNOSIS — M069 Rheumatoid arthritis, unspecified: Secondary | ICD-10-CM

## 2021-10-21 DIAGNOSIS — J849 Interstitial pulmonary disease, unspecified: Secondary | ICD-10-CM

## 2021-10-21 NOTE — Patient Instructions (Addendum)
There is slight progression of the inflammation of the lungs on your recent CT scan. 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  We will discuss your case at the Interstitial Lung Disease conference tomorrow. The antifibrotic medications we can consider startgin you on would be  pirfenidone or nintedanib.   Please have your rheumatologist send their office notes to [place] after next evaluation.   Follow up in 6 months with pulmonary function tests.

## 2021-10-21 NOTE — Progress Notes (Signed)
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  He remains short of breath with exertion with easy fatigability which remains about the same since last visit. He is not using nebulizer treatments as this gave him chest pains. He is working with his Rheumatology team for a maintenance regimen he can tolerate as he had reactions to sulfasalazine and leflunomide. He is currently taking 5mg  prednisone daily with on going joint issues.  HRCT Chest 10/18/21 does show some mild progression of peripheral predominant ground glass attenuation with subpleural reticulation  He reports elevated HR which he has noticed running in the 90s to low 100s in the mornings.  OV 05/24/21 He has been doing well since last visit visit. He continues to have significant shortness of breath with activity and increased fatigue with activity. The anoro made his cough worse. He is requesting a nebulizer machine and nebulizer treatments as he felt these have helped in the past with his breathing.  He is now seeing a rheumatologist for his RA. He remains on 15mg  daily and will be tapering down in the future with the addition of another RA treatment.  PFTs today show FEV1/FVC 87%, FEV1 2.06L (80%), FVC 2.37L (66%) and DLCO 15.56 (69%), relatively stable since 09/2020.   OV 08/21/20 He was diagnosed with rheumatoid arthritis 8 years ago and has been managed on methotrexate and 5mg  of prednisone daily for the joint pain.   The patient was diagnosed with CML in 01/2020 and was started on Dasatinib in 02/2020. By the end of May he was noted to be having increasing shortness of breath and  wheezing. A CT chest scan 03/02/20 showed bilateral peripherally predominant patchy areas of ground glass attentuation, interstitial thickening and reticulation with mild traction bronchiectasis. Cardiac echo at this time showed EF 60-65%, mild aortic stenosis and mild left atrial dilation with normal RV function. 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  He had initial improvement in his breathing with the steroid taper but he continued to experience exertional shortness of breath with work. He was first seen in pulmonary clinic 07/03/20 and reports his breathing has been the same since then with no further improvement. He has remained on 10mg  of prednisone daily for the rheumatoid arthritis, which he has been experiencing pain in his hands.   He was last seen by hematology/oncology on 08/02/20. He is currently on 200mg  of Imatinib daily for the CML as he had an increase in his WBC count at that time. He continues to have some mucositis symptoms which are maintained with magic mouth wash.    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[place])  COPD (chronic obstructive pulmonary disease) (HCC)    Coronary artery disease    Diabetes mellitus without complication (HCC)    Dyspnea    GERD (gastroesophageal reflux disease)    Hypertension    Leukemia (HCC)    Palpitations    Pneumonia 04/2020   Renal insufficiency      Family History  Problem Relation Age of Onset   Heart attack Mother    Heart attack Father    Diabetes Father      Social History   Socioeconomic  History   Marital status: Married    Spouse name: Not on file   Number of children: Not on file   Years of education: Not on file   Highest education level: Not on file  Occupational History   Not on file  Tobacco Use   Smoking status: Never   Smokeless tobacco: Never  Vaping Use   Vaping Use: Never used  Substance and Sexual Activity   Alcohol use: Never   Drug use: Never   Sexual activity: Not on file  Other Topics Concern   Not on file  Social History Narrative   Not on file   Social Determinants of Health   Financial Resource Strain: Not on file  Food Insecurity: Not on file  Transportation Needs: Not on file  Physical Activity: Not on file  Stress: Not on file  Social Connections: Not on file  Intimate Partner Violence: Not on file     Allergies  Allergen Reactions   Oxycodone Shortness Of Breath   Eggs Or Egg-Derived Products     Drainage in the back of throat   Flagyl [Metronidazole]     Couldn't urinate   Penicillins Rash    Has patient had a PCN reaction causing immediate rash, facial/tongue/throat swelling, SOB or lightheadedness with hypotension: Yes Has patient had a PCN reaction causing severe rash involving mucus membranes or skin necrosis: No Has patient had a PCN reaction that required hospitalization: No Has patient had a PCN reaction occurring within the last 10 years: No If all of the above answers are "NO", then may proceed with Cephalosporin use.'     Outpatient Medications Prior to Visit  Medication Sig Dispense Refill   acetaminophen (TYLENOL) 500 MG tablet Take 1,000 mg by mouth every 6 (six) hours as needed for moderate pain.     aspirin EC 81 MG tablet Take 81 mg by mouth 2 (two) times daily.     atorvastatin (LIPITOR) 10 MG tablet Take 10 mg by mouth at bedtime.      Cholecalciferol (VITAMIN D3) 5000 units TABS Take 5,000 Units by mouth daily.      clopidogrel (PLAVIX) 75 MG tablet Take 75 mg by mouth daily.     cyanocobalamin 1000 MCG  tablet Take 3,000 mcg by mouth daily.     cyclobenzaprine (FLEXERIL) 10 MG tablet Take 10 mg by mouth 2 (two) times daily.     ferrous sulfate 325 (65 FE) MG tablet Take 1 tablet by mouth daily.     fluconazole (DIFLUCAN) 150 MG tablet Take 150 mg by mouth daily.     fluticasone (FLONASE) 50 MCG/ACT nasal spray Place 2 sprays into both nostrils daily.     folic acid (FOLVITE) 1 MG tablet Take 1 mg by mouth daily.     furosemide (LASIX) 20 MG tablet Take 20 mg by mouth daily as needed for fluid.     gabapentin (NEURONTIN) 600 MG tablet Take 600 mg by  mouth 3 (three) times daily.     imatinib (GLEEVEC) 100 MG tablet Take 300 mg by mouth daily at 6 (six) AM.     levocetirizine (XYZAL) 5 MG tablet Take 5 mg by mouth at bedtime.     losartan-hydrochlorothiazide (HYZAAR) 100-12.5 MG tablet Take 0.5 tablets by mouth daily.     magic mouthwash SOLN Take 5 mLs by mouth daily as needed for mouth pain.     nitroGLYCERIN (NITROSTAT) 0.4 MG SL tablet Place 0.4 mg under the tongue every 5 (five) minutes as needed for chest pain.     Omega-3 Fatty Acids (FISH OIL) 1000 MG CAPS Take 2,000 mg by mouth 2 (two) times daily.      ondansetron (ZOFRAN) 8 MG tablet Take 8 mg by mouth every 8 (eight) hours as needed for vomiting or nausea.     pantoprazole (PROTONIX) 40 MG tablet Take 40 mg by mouth daily.     PHOS-NAK 280-160-250 MG PACK Take 1 packet by mouth 2 (two) times daily.     polyethylene glycol (MIRALAX / GLYCOLAX) packet Take 17 g by mouth daily.     SM ATHLETES FOOT 1 % cream Apply 1 application topically 2 (two) times daily. to affected area(s)     sulfaSALAzine (AZULFIDINE) 500 MG tablet Take 2 tablets by mouth 2 (two) times daily.     tamsulosin (FLOMAX) 0.4 MG CAPS capsule Take 0.4 mg by mouth daily.     valACYclovir (VALTREX) 1000 MG tablet Take 1,000 mg by mouth daily.     carbamide peroxide (DEBROX) 6.5 % OTIC solution Place 5 drops into both ears 2 (two) times daily.     ipratropium-albuterol  (DUONEB) 0.5-2.5 (3) MG/3ML SOLN Take 3 mLs by nebulization every 6 (six) hours as needed. 1080 mL 0   No facility-administered medications prior to visit.   Review of Systems  Constitutional:  Positive for malaise/fatigue. Negative for chills, diaphoresis, fever and weight loss.  HENT:  Negative for congestion, nosebleeds, sinus pain and sore throat.   Eyes:  Negative for blurred vision.  Respiratory:  Positive for shortness of breath. Negative for cough, hemoptysis, sputum production and wheezing.   Cardiovascular:  Negative for chest pain, palpitations, orthopnea, claudication, leg swelling and PND.  Gastrointestinal:  Negative for abdominal pain, blood in stool, diarrhea, heartburn, melena, nausea and vomiting.  Genitourinary:  Negative for dysuria, frequency and hematuria.  Musculoskeletal:  Positive for joint pain. Negative for myalgias.  Skin:  Negative for itching and rash.  Neurological:  Negative for dizziness, weakness and headaches.  Endo/Heme/Allergies:  Does not bruise/bleed easily.  Psychiatric/Behavioral: Negative.     Objective:   Vitals:   10/21/21 1106  BP: 126/62  Pulse: (!) 104  SpO2: 98%  Weight: 175 lb (79.4 kg)  Height: 5\' 7"  (1.702 m)   Physical Exam Constitutional:      General: He is not in acute distress.    Appearance: Normal appearance. He is normal weight. He is not ill-appearing.  HENT:     Head: Normocephalic and atraumatic.     Nose: Nose normal.     Mouth/Throat:     Mouth: Mucous membranes are moist.     Pharynx: Oropharynx is clear.  Eyes:     Extraocular Movements: Extraocular movements intact.     Conjunctiva/sclera: Conjunctivae normal.     Pupils: Pupils are equal, round, and reactive to light.  Cardiovascular:     Rate and Rhythm: Normal rate and regular rhythm.  Pulses: Normal pulses.     Heart sounds: Murmur (systolic) heard.  Pulmonary:     Effort: Pulmonary effort is normal. No respiratory distress.     Breath sounds:  Examination of the left-middle field reveals rales. Rales (dry, bibasilar) present. No wheezing or rhonchi.  Abdominal:     General: Abdomen is flat. Bowel sounds are normal. There is no distension.     Palpations: Abdomen is soft.     Tenderness: There is no abdominal tenderness.  Musculoskeletal:     Cervical back: Neck supple.     Right lower leg: No edema.     Left lower leg: No edema.  Lymphadenopathy:     Cervical: No cervical adenopathy.  Skin:    General: Skin is warm and dry.     Capillary Refill: Capillary refill takes less than 2 seconds.  Neurological:     General: No focal deficit present.     Mental Status: He is alert and oriented to person, place, and time. Mental status is at baseline.     Gait: Gait normal.    CBC    Component Value Date/Time   WBC 7.4 06/29/2018 0247   RBC 3.79 (L) 06/29/2018 0247   HGB 11.6 (L) 06/29/2018 0247   HCT 34.9 (L) 06/29/2018 0247   PLT 237 06/29/2018 0247   MCV 92.1 06/29/2018 0247   MCH 30.6 06/29/2018 0247   MCHC 33.2 06/29/2018 0247   RDW 13.4 06/29/2018 0247   Chest imaging: HRCT Chest 10/18/21 1. Progressively worsening interstitial lung disease with imaging findings considered probable usual interstitial pneumonia (UIP) per current ATS guidelines. Given the progression compared to the prior study, UIP is strongly favored. 2. Aortic atherosclerosis, in addition to left main and three-vessel coronary artery disease. Assessment for potential risk factor modification, dietary therapy or pharmacologic therapy may be warranted, if clinically indicated. 3. There are calcifications of the aortic valve and mitral annulus. Echocardiographic correlation for evaluation of potential valvular dysfunction may be warranted if clinically indicated. 4. Mild hepatic steatosis.  09/14/20 HRCT Chest Mediastinum/Nodes: No pathologically enlarged mediastinal or hilar lymph nodes. Please note that accurate exclusion of hilar adenopathy is  limited on noncontrast CT scans. Esophagus is unremarkable in appearance. No axillary lymphadenopathy.   Lungs/Pleura: High-resolution images demonstrate patchy areas of peripheral predominant ground-glass attenuation, septal thickening, subpleural reticulation, parenchymal banding, cylindrical bronchiectasis and peripheral bronchiolectasis. No definitive honeycombing identified. Inspiratory and expiratory imaging is unremarkable. No acute consolidative airspace disease. No pleural effusions. Calcified granuloma in the right middle lobe. Previously noted 3 mm right upper lobe nodule has completely resolved. No definite suspicious appearing pulmonary nodules or masses are noted.  03/02/20 CT Chest: 1. Bilateral peripheral predominant patchy areas of ground-glass  attenuation, interstitial thickening and reticulation with mild  traction bronchiectasis. Findings compatible with interstitial lung  disease, favor hypersensitivity pneumonitis. Cannot rule out drug  toxicity. No frank honeycombing identified at this point. Recommend  follow-up imaging in 6 months with high-resolution CT of the chest  to assess for any temporal changes in the appearance of the lungs.  2. 3 mm right upper lobe lung nodule. No follow-up needed if patient  is low-risk. Non-contrast chest CT can be considered in 12 months if  patient is high-risk.  3. 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  4. Aortic atherosclerosis, in addition to 3 vessel coronary artery  disease. Please note that although the presence of  coronary artery  calcium documents the presence of coronary artery disease, the  severity of this disease and any potential stenosis cannot be  assessed on this non-gated CT examination. Assessment for potential  risk factor modification, dietary therapy or pharmacologic therapy  may be  warranted, if clinically indicated.   CXR 03/27/20 Bilateral interstitial prominence again noted. Interstitial changes  are less prominent than noted on prior CT of 03/02/2020  CXR 04/15/20 Lungs are hypoinflated with  stable elevation of the left hemidiaphragm. There is interval  worsening of patchy bilateral hazy mixed interstitial airspace  density. Note that a patchy bilateral ground-glass process was noted  on the recent previous CT. Linear atelectasis over the left  perihilar region. No effusion. Cardiomediastinal silhouette is  normal. Remainder of the exam is unchanged.   PFT: 2022: FEV1/FVC 87%, FEV1 2.06L (80%), FVC 2.37L (66%) and DLCO 15.56 (69%)  2021: FEV1/FVC 88, FEV1 2.22L (86%), FVC 2.53L (70%), TLC 5.2L (82%), DLCO 14.99 (67%)   Labs: 06/21/20 CBC: WBC 47.9, HGB 12, HCT 38.2, Plt 895 06/21/20 BMP: Na 135, K 3.9, Cl 99, Co2 26.8, Cr 1.56, BUN 18   08/02/20 CBC: WBC 32.2, HGB 11.7, HCT 36.3, PLT 983 08/02/20 BMP: Na 133, K 4.6, Cl 95, Co2 29.7, Cr 1.72, BUN 8   Echo: 03/02/20   1. Technically difficult study due to chest wall/lung interference.    2. The left ventricle is normal in size with normal wall thickness.    3. The left ventricular systolic function is normal, LVEF is visually  estimated at 60-65%.    4. Visually there is mild aortic valve stenosis.    5. The left atrium is mildly dilated in size.    6. The right ventricle is normal in size, with normal systolic function.   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  His dyspnea and activity level remain stable compared to last visit. There is slight progression of  ILD features on his recent HRCT chest scan with probable UIP pattern. 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 Will discuss with his rheumatology team if mycophenolate is a treatment option. Given his UIP pattern he does qualify for antiifbrotic therapy which we will consider once antiinflammatory agent is decided upon.  Follow up pulmonary function tests in 6 months with office visit.   Freda Jackson, MD [facility] Office: [phone]    Current Outpatient Medications:    acetaminophen (TYLENOL) 500 MG tablet, Take 1,000 mg by mouth every 6 (six) hours as needed for moderate pain., Disp: , Rfl:    aspirin EC 81 MG tablet, Take 81 mg by mouth 2 (two) times daily., Disp: , Rfl:    atorvastatin (LIPITOR) 10 MG tablet, Take 10 mg by mouth at bedtime. , Disp: , Rfl:    Cholecalciferol (VITAMIN D3) 5000 units TABS, Take 5,000 Units by mouth daily. , Disp: , Rfl:    clopidogrel (PLAVIX) 75 MG tablet, Take 75 mg by mouth daily., Disp: , Rfl:    cyanocobalamin 1000 MCG tablet, Take 3,000 mcg by mouth daily., Disp: , Rfl:    cyclobenzaprine (FLEXERIL) 10 MG tablet, Take 10 mg by mouth  2 (two) times daily., Disp: , Rfl:    ferrous sulfate 325 (65 FE) MG tablet, Take 1 tablet by mouth daily., Disp: , Rfl:    fluconazole (DIFLUCAN) 150 MG tablet, Take 150 mg by mouth daily., Disp: , Rfl:    fluticasone (FLONASE) 50 MCG/ACT nasal spray, Place 2 sprays into both nostrils daily., Disp: , Rfl:    folic acid (FOLVITE) 1 MG tablet, Take 1 mg by mouth daily., Disp: , Rfl:    furosemide (LASIX) 20 MG tablet, Take 20 mg by mouth daily as needed for fluid., Disp: , Rfl:    gabapentin (NEURONTIN) 600 MG tablet, Take 600 mg by mouth 3 (three) times daily., Disp: , Rfl:    imatinib (GLEEVEC) 100 MG tablet, Take 300 mg by mouth daily at 6 (six) AM., Disp: , Rfl:    levocetirizine (XYZAL) 5 MG tablet, Take 5 mg by mouth at bedtime., Disp: , Rfl:     losartan-hydrochlorothiazide (HYZAAR) 100-12.5 MG tablet, Take 0.5 tablets by mouth daily., Disp: , Rfl:    magic mouthwash SOLN, Take 5 mLs by mouth daily as needed for mouth pain., Disp: , Rfl:    nitroGLYCERIN (NITROSTAT) 0.4 MG SL tablet, Place 0.4 mg under the tongue every 5 (five) minutes as needed for chest pain., Disp: , Rfl:    Omega-3 Fatty Acids (FISH OIL) 1000 MG CAPS, Take 2,000 mg by mouth 2 (two) times daily. , Disp: , Rfl:    ondansetron (ZOFRAN) 8 MG tablet, Take 8 mg by mouth every 8 (eight) hours as needed for vomiting or nausea., Disp: , Rfl:    pantoprazole (PROTONIX) 40 MG tablet, Take 40 mg by mouth daily., Disp: , Rfl:    PHOS-NAK 280-160-250 MG PACK, Take 1 packet by mouth 2 (two) times daily., Disp: , Rfl:    polyethylene glycol (MIRALAX / GLYCOLAX) packet, Take 17 g by mouth daily., Disp: , Rfl:    SM ATHLETES FOOT 1 % cream, Apply 1 application topically 2 (two) times daily. to affected area(s), Disp: , Rfl:    sulfaSALAzine (AZULFIDINE) 500 MG tablet, Take 2 tablets by mouth 2 (two) times daily., Disp: , Rfl:    tamsulosin (FLOMAX) 0.4 MG CAPS capsule, Take 0.4 mg by mouth daily., Disp: , Rfl:    valACYclovir (VALTREX) 1000 MG tablet, Take 1,000 mg by mouth daily., Disp: , Rfl:

## 2021-10-24 ENCOUNTER — Telehealth: Payer: Self-pay | Admitting: Pulmonary Disease

## 2021-10-24 ENCOUNTER — Encounter: Payer: Self-pay | Admitting: Pulmonary Disease

## 2021-10-24 NOTE — Telephone Encounter (Signed)
Please send my recent office note on this patient to Leafy Kindle, [place] at [facility].  Also, please call their office and leave her a message asking if mycophenolate mofetil is an option for treatment of his RA which will also help with the ILD associated with the RA.   Thanks, JD

## 2021-11-13 NOTE — Telephone Encounter (Signed)
Called [facility] but Sharyn Lull was not available. I will send a copy of the recent office note to her office.

## 2021-12-04 ENCOUNTER — Ambulatory Visit: Payer: Medicare Other | Admitting: Cardiology

## 2021-12-04 ENCOUNTER — Encounter: Payer: Self-pay | Admitting: Cardiology

## 2021-12-04 VITALS — BP 150/64 | HR 92 | Ht 67.0 in | Wt 180.0 lb

## 2021-12-04 DIAGNOSIS — R0602 Shortness of breath: Secondary | ICD-10-CM

## 2021-12-04 DIAGNOSIS — I350 Nonrheumatic aortic (valve) stenosis: Secondary | ICD-10-CM | POA: Diagnosis not present

## 2021-12-04 DIAGNOSIS — I2510 Atherosclerotic heart disease of native coronary artery without angina pectoris: Secondary | ICD-10-CM

## 2021-12-04 DIAGNOSIS — I10 Essential (primary) hypertension: Secondary | ICD-10-CM | POA: Diagnosis not present

## 2021-12-04 MED ORDER — NITROGLYCERIN 0.4 MG SL SUBL: 0.4000 mg | SUBLINGUAL_TABLET | SUBLINGUAL | 5 refills | Status: AC | PRN

## 2021-12-04 NOTE — Patient Instructions (Signed)

## 2021-12-04 NOTE — Progress Notes (Signed)
Clinical Summary [name] is a 81 y.o.male seen today for follow up of the following medical problems.      1. CAD - history of DES x 2 to LAD in 2019 - from notes interventional had recommended indefinite DAPT - 02/2020 echo LVEF 60-65%   09/2021 nuclear stress: no ischemia, low risk   - chronic SOB/DOE. No chest pains   2. Hyperlipidemia - Higher atorvastatin doses causes muscle aches - lipid labs followed by pcp, has upcoming visit   3. HTN - compliant with meds - at recent onc visit 119/76 -renal lowered hyzaar dose.    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 Aortic stenosis - very mild by 02/2020 echo. Mean grad 9, AVA 1.36, VTI 2.5   07/2021 echo LVEF 65-70%. Mild to mod AS mean grad 14, AVA VTI, DI 0.54, SVI 27     6. Chronic diastolic HF - 94/5038 echo LVEF 65-70%, grade I DD - recent visit was reporting some abdominal distension   - no recent edema     7. 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[place])    COPD (chronic obstructive pulmonary disease) ([place])    Coronary artery disease    Diabetes mellitus without complication (HCC)    Dyspnea    GERD (gastroesophageal reflux disease)    Hypertension    Leukemia (HCC)    Palpitations    Pneumonia 04/2020   Renal insufficiency      Allergies  Allergen Reactions   Oxycodone Shortness Of Breath   Eggs Or Egg-Derived Products     Drainage in the back of throat   Flagyl [Metronidazole]     Couldn't urinate   Penicillins Rash    Has patient had a PCN reaction causing immediate rash,  facial/tongue/throat swelling, SOB or lightheadedness with hypotension: Yes Has patient had a PCN reaction causing severe rash involving mucus membranes or skin necrosis: No Has patient had a PCN reaction that required hospitalization: No Has patient had a PCN reaction occurring within the last 10 years: No If all of the above answers are "NO", then may proceed with Cephalosporin use.'     Current Outpatient Medications  Medication Sig Dispense Refill   acetaminophen (TYLENOL) 500 MG tablet Take 1,000 mg by mouth every 6 (six) hours as needed for moderate pain.     aspirin EC 81 MG tablet Take 81 mg by mouth 2 (two) times daily.     atorvastatin (LIPITOR) 10 MG tablet Take 10 mg by mouth at bedtime.      Cholecalciferol (VITAMIN D3) 5000 units TABS Take 5,000 Units by mouth daily.      clopidogrel (PLAVIX) 75 MG tablet Take 75 mg by mouth daily.     cyanocobalamin 1000 MCG tablet Take 3,000 mcg by mouth daily.     cyclobenzaprine (FLEXERIL) 10 MG tablet Take 10 mg by mouth 2 (two) times daily.     ferrous sulfate 325 (65 FE) MG tablet Take 1 tablet by mouth daily.  fluconazole (DIFLUCAN) 150 MG tablet Take 150 mg by mouth daily.     fluticasone (FLONASE) 50 MCG/ACT nasal spray Place 2 sprays into both nostrils daily.     folic acid (FOLVITE) 1 MG tablet Take 1 mg by mouth daily.     furosemide (LASIX) 20 MG tablet Take 20 mg by mouth daily as needed for fluid.     gabapentin (NEURONTIN) 600 MG tablet Take 600 mg by mouth 3 (three) times daily.     imatinib (GLEEVEC) 100 MG tablet Take 300 mg by mouth daily at 6 (six) AM.     levocetirizine (XYZAL) 5 MG tablet Take 5 mg by mouth at bedtime.     losartan-hydrochlorothiazide (HYZAAR) 100-12.5 MG tablet Take 0.5 tablets by mouth daily.     magic mouthwash SOLN Take 5 mLs by mouth daily as needed for mouth pain.     nitroGLYCERIN (NITROSTAT) 0.4 MG SL tablet Place 0.4 mg under the tongue every 5 (five) minutes as needed for chest pain.      Omega-3 Fatty Acids (FISH OIL) 1000 MG CAPS Take 2,000 mg by mouth 2 (two) times daily.      ondansetron (ZOFRAN) 8 MG tablet Take 8 mg by mouth every 8 (eight) hours as needed for vomiting or nausea.     pantoprazole (PROTONIX) 40 MG tablet Take 40 mg by mouth daily.     PHOS-NAK 280-160-250 MG PACK Take 1 packet by mouth 2 (two) times daily.     polyethylene glycol (MIRALAX / GLYCOLAX) packet Take 17 g by mouth daily.     SM ATHLETES FOOT 1 % cream Apply 1 application topically 2 (two) times daily. to affected area(s)     tamsulosin (FLOMAX) 0.4 MG CAPS capsule Take 0.4 mg by mouth daily.     valACYclovir (VALTREX) 1000 MG tablet Take 1,000 mg by mouth daily.     No current facility-administered medications for this visit.     Past Surgical History:  Procedure Laterality Date   APPENDECTOMY  1987   CARDIAC CATHETERIZATION  2019   CATARACT EXTRACTION  11/2009 & 12/2015   CORONARY STENT INTERVENTION  06/28/2018   CORONARY STENT INTERVENTION N/A 06/28/2018   Procedure: CORONARY STENT INTERVENTION;  Surgeon: Nelva Bush, MD;  Location: [facility];  Service: Cardiovascular;  Laterality: N/A;   GALLBLADDER SURGERY  1989   HYDROCELE EXCISION Right 03/18/2021   Procedure: HYDROCELECTOMY ADULT;  Surgeon: Cleon Gustin, MD;  Location: [facility];  Service: Urology;  Laterality: Right;   INTRAVASCULAR ULTRASOUND/IVUS N/A 06/28/2018   Procedure: Intravascular Ultrasound/IVUS;  Surgeon: Nelva Bush, MD;  Location: [facility];  Service: Cardiovascular;  Laterality: N/A;   NASAL SINUS SURGERY  11/21/1987   RIGHT/LEFT HEART CATH AND CORONARY ANGIOGRAPHY N/A 06/28/2018   Procedure: RIGHT/LEFT HEART CATH AND CORONARY ANGIOGRAPHY;  Surgeon: Nelva Bush, MD;  Location: [facility];  Service: Cardiovascular;  Laterality: N/A;   SHOULDER SURGERY  2007   stent in lad  06/07/2004   TONSILLECTOMY  1971   trigerfinger  1990     Allergies  Allergen Reactions   Oxycodone  Shortness Of Breath   Eggs Or Egg-Derived Products     Drainage in the back of throat   Flagyl [Metronidazole]     Couldn't urinate   Penicillins Rash    Has patient had a PCN reaction causing immediate rash, facial/tongue/throat swelling, SOB or lightheadedness with hypotension: Yes Has patient had a PCN reaction causing severe rash  involving mucus membranes or skin necrosis: No Has patient had a PCN reaction that required hospitalization: No Has patient had a PCN reaction occurring within the last 10 years: No If all of the above answers are "NO", then may proceed with Cephalosporin use.'      Family History  Problem Relation Age of Onset   Heart attack Mother    Heart attack Father    Diabetes Father      Social History Mr. Okane reports that he has never smoked. He has never used smokeless tobacco. Mr. Reinders reports no history of alcohol use.   Review of Systems CONSTITUTIONAL: No weight loss, fever, chills, weakness or fatigue.  HEENT: Eyes: No visual loss, blurred vision, double vision or yellow sclerae.No hearing loss, sneezing, congestion, runny nose or sore throat.  SKIN: No rash or itching.  CARDIOVASCULAR: per hpi RESPIRATORY: No shortness of breath, cough or sputum.  GASTROINTESTINAL: No anorexia, nausea, vomiting or diarrhea. No abdominal pain or blood.  GENITOURINARY: No burning on urination, no polyuria NEUROLOGICAL: No headache, dizziness, syncope, paralysis, ataxia, numbness or tingling in the extremities. No change in bowel or bladder control.  MUSCULOSKELETAL: No muscle, back pain, joint pain or stiffness.  LYMPHATICS: No enlarged nodes. No history of splenectomy.  PSYCHIATRIC: No history of depression or anxiety.  ENDOCRINOLOGIC: No reports of sweating, cold or heat intolerance. No polyuria or polydipsia.  Marland Kitchen   Physical Examination Today's Vitals   12/04/21 0940  BP: (!) 150/64  Pulse: 92  SpO2: 98%  Weight: 180 lb (81.6 kg)  Height: 5\' 7"   (1.702 m)   Body mass index is 28.19 kg/m.  Gen: resting comfortably, no acute distress HEENT: no scleral icterus, pupils equal round and reactive, no palptable cervical adenopathy,  CV: RRR, 3/6 systolic murmur rusb Resp: bilateral dry crackles GI: abdomen is soft, non-tender, non-distended, normal bowel sounds, no hepatosplenomegaly MSK: extremities are warm, no edema.  Skin: warm, no rash Neuro:  no focal deficits Psych: appropriate affect   Diagnostic Studies  02/2020 echo [facility] Summary    1. Technically difficult study due to chest wall/lung interference.    2. The left ventricle is normal in size with normal wall thickness.    3. The left ventricular systolic function is normal, LVEF is visually  estimated at 60-65%.    4. Visually there is mild aortic valve stenosis.    5. The left atrium is mildly dilated in size.    6. The right ventricle is normal in size, with normal systolic function.    06/2018 cath Conclusions: Severe single-vessel coronary artery involving proximal and distal edges/segments of previously placed proximal LAD stent. Mild, non-obstructive CAD involving mid RCA. Normal left heart, right heart, and pulmonary artery pressures. Normal left ventricular systolic function and Fick cardiac output. Successful IVUS-guided PCI to proximal and mid LAD with placement of Synergy 2.5 x 16 mm and Synergy 2.25 x 20 mm drug-eluting stents.  Both stents overlap the previously placed Taxus stent in the proximal LAD.  Final angiogram shows 0% residual stenosis with TIMI-3 flow.   Recommendations: Aggressive secondary prevention. Overnight extended recovery due to significant right arm/shoulder pain during and after catheterization secondary to chronic arthritis.     02/2020 echo [facility] Summary    1. Technically difficult study due to chest wall/lung interference.    2. The left ventricle is normal in size with normal wall thickness.    3. The left ventricular  systolic function is normal, LVEF is visually  estimated at 60-65%.    4. Visually there is mild aortic valve stenosis.    5. The left atrium is mildly dilated in size.    6. The right ventricle is normal in size, with normal systolic function.         07/2021 echo IMPRESSIONS     1. Left ventricular ejection fraction, by estimation, is 65 to 70%. The  left ventricle has normal function. The left ventricle has no regional  wall motion abnormalities. Left ventricular diastolic parameters are  consistent with Grade I diastolic  dysfunction (impaired relaxation).   2. Right ventricular systolic function is normal. The right ventricular  size is normal. Tricuspid regurgitation signal is inadequate for assessing  PA pressure.   3. The mitral valve is abnormal. Trivial mitral valve regurgitation.   4. The aortic valve is tricuspid with restricted motion of the left  coronary cusp. There is moderate calcification of the aortic valve. Aortic  valve regurgitation is not visualized. Mild to moderate aortic valve  stenosis. Aortic valve mean gradient  measures 14.0 mmHg. Dimentionless index 0.54.   5. The inferior vena cava is dilated in size with >50% respiratory  variability, suggesting right atrial pressure of 8 mmHg.     09/2021 nuclear stress Findings are consistent with no ischemia. The study is low risk.   No ST deviation was noted. The ECG was negative for ischemia.   LV perfusion is normal in the setting of extracardiac rib tracer uptake adjacent to the lateral wall.  No significant, reversible perfusion defect indicate ischemia.   Left ventricular function is normal. Nuclear stress EF: 79 %.   Low risk study, no ischemic territories noted and LVEF 79%.    Assessment and Plan  1. CAD  - has been committed to indefinitie DAPT - recent stress test without ischemia - no recent chest pains, chronic SOB/DOE appears to be pulmonary.  - continue current meds       2. HTN -  elevated here but last several visits with other providers over last 2 months has been at goal. COntinue current meds   3. Hyperlipidemia -  troubles tolerating higher dose -request labs from pcp, continue atorvastatin.    4. Aortic stenosis - mild to moderate by most recent US, repeat echo 2-3 years  5. SOB - appears to be pulmonary. Recent echo and nuclear stress test were overall benign, HRCT did show some progression of his ILD.    F/u 6 months   Arnoldo Lenis, M.D.

## 2022-06-04 ENCOUNTER — Telehealth: Payer: Self-pay | Admitting: *Deleted

## 2022-06-04 NOTE — Telephone Encounter (Signed)
Eithan L. Daman 81 year old male is requesting preoperative cardiac evaluation for lumbar spine L5-S1 injection.   He was last seen in the clinic on 12/04/2021.  During that time he remained stable from a cardiac standpoint.  His stress testing 12/22 showed low risk and no ischemia.  He denied chest pains.   His PMH includes coronary artery disease status post DES x2 to his LAD in 2019, hyperlipidemia, hypertension, CML, chronic diastolic CHF and aortic stenosis.  May his aspirin and Plavix be held prior to his procedure?  Thank you for your help.  Please direct your response to [facility] preop pool.  Jossie Ng. Kaid Seeberger NP-C     06/04/2022, 1:05 PM [facility] [place] [address] Office [phone] Fax [phone]

## 2022-06-04 NOTE — Telephone Encounter (Signed)
   Pre-operative Risk Assessment    Patient Name: Lonnie Duffy  DOB: Nov 07, 1940 MRN: [id number]      Request for Surgical Clearance    Procedure:   Lumbar Spine L5-S1 transforaminal injection  Date of Surgery:  Clearance 06/23/2022                                 Surgeon:  Dr. Lenord Carbo Surgeon's Group or Practice Name:  [facility] Phone number:  [phone] Fax number:  [phone]   Type of Clearance Requested:   - Pharmacy:  Hold Clopidogrel (Plavix) 7 days prior. The patient can resume the blood thinner the day after   Type of Anesthesia:  Local    Additional requests/questions:    Signed, Marlou Sa   06/04/2022, 9:50 AM

## 2022-06-11 ENCOUNTER — Ambulatory Visit: Payer: Medicare Other | Attending: Cardiology

## 2022-06-11 ENCOUNTER — Ambulatory Visit: Payer: Medicare Other | Attending: Cardiology | Admitting: Cardiology

## 2022-06-11 ENCOUNTER — Encounter: Payer: Self-pay | Admitting: Cardiology

## 2022-06-11 VITALS — BP 130/60 | HR 88 | Ht 67.0 in | Wt 185.6 lb

## 2022-06-11 DIAGNOSIS — R0602 Shortness of breath: Secondary | ICD-10-CM

## 2022-06-11 DIAGNOSIS — I2510 Atherosclerotic heart disease of native coronary artery without angina pectoris: Secondary | ICD-10-CM | POA: Diagnosis not present

## 2022-06-11 DIAGNOSIS — I10 Essential (primary) hypertension: Secondary | ICD-10-CM

## 2022-06-11 DIAGNOSIS — E782 Mixed hyperlipidemia: Secondary | ICD-10-CM

## 2022-06-11 DIAGNOSIS — I350 Nonrheumatic aortic (valve) stenosis: Secondary | ICD-10-CM

## 2022-06-11 LAB — ECHOCARDIOGRAM COMPLETE
AR max vel: 1.33 cm2
AV Area VTI: 1.55 cm2
AV Area mean vel: 1.19 cm2
AV Mean grad: 16.7 mmHg
AV Peak grad: 30 mmHg
Ao pk vel: 2.74 m/s
Area-P 1/2: 1.56 cm2
Calc EF: 61.9 %
Height: 67 in
MV VTI: 1.06 cm2
S' Lateral: 1.82 cm
Single Plane A2C EF: 60.6 %
Single Plane A4C EF: 62.6 %
Weight: 2969.6 oz

## 2022-06-11 MED ORDER — AMLODIPINE BESYLATE 5 MG PO TABS: 5.0000 mg | ORAL_TABLET | Freq: Every day | ORAL | 1 refills | Status: AC

## 2022-06-11 NOTE — Progress Notes (Signed)
Clinical Summary Lonnie Duffy is a 81 y.o.maleseen today for follow up of the following medical problems.      1. CAD - history of DES x 2 to LAD in 2019 - from notes interventional had recommended indefinite DAPT - 02/2020 echo LVEF 60-65%   09/2021 nuclear stress: no ischemia, low risk  - some chest tightness at times, some fatigue. Left sided, can occur at rest but more with activity. Some SOB with activities. 4/10 in severity. Lasts about 10 minutes. Can be worst with deep breathing.     2. Hyperlipidemia - Higher atorvastatin doses causes muscle aches - lipid labs followed by pcp, has upcoming visit 07/2021 TC 129 TG 178 HDL 39 LDL 60   3. HTN - compliant with meds - at recent onc visit 119/76 -renal lowered hyzaar dose.   - home bp's usually 130s/60s.   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 Aortic stenosis - very mild by 02/2020 echo. Mean grad 9, AVA 1.36, VTI 2.5   07/2021 echo LVEF 65-70%. Mild to mod AS mean grad 14, AVA VTI 1.36, DI 0.54, SVI 27 - some progression DOE unclear if valve related given other comorbidities, generalized fatigue not specifically cardiac in description.    6. 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 Rheumatoid arthritis   9. 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[place])    COPD (chronic obstructive pulmonary disease) (HCC)    Coronary artery disease    Diabetes mellitus without complication (HCC)    Dyspnea    GERD  (gastroesophageal reflux disease)    Hypertension    Leukemia (HCC)    Palpitations    Pneumonia 04/2020   Renal insufficiency      Allergies  Allergen Reactions   Oxycodone Shortness Of Breath   Eggs Or Egg-Derived Products     Drainage in the back of throat   Flagyl [Metronidazole]     Couldn't urinate   Penicillins Rash    Has patient had a PCN reaction causing immediate rash, facial/tongue/throat swelling, SOB or lightheadedness with hypotension: Yes Has patient had a PCN reaction causing severe rash involving mucus membranes or skin necrosis: No Has patient had a PCN reaction that required hospitalization: No Has patient had a PCN reaction occurring within the last 10 years: No If all of the above answers are "NO", then may proceed with Cephalosporin use.'     Current Outpatient Medications  Medication Sig Dispense Refill   acetaminophen (TYLENOL) 500 MG tablet Take 1,000 mg by mouth every 6 (six) hours as needed for moderate pain.     aspirin EC 81 MG tablet Take 162 mg by mouth daily.     atorvastatin (LIPITOR) 10 MG tablet Take 10 mg by mouth at bedtime.      Cholecalciferol (VITAMIN D3) 5000 units TABS Take 5,000 Units by mouth daily.  clopidogrel (PLAVIX) 75 MG tablet Take 75 mg by mouth daily.     cyanocobalamin 1000 MCG tablet Take 3,000 mcg by mouth daily.     cyclobenzaprine (FLEXERIL) 10 MG tablet Take 10 mg by mouth 2 (two) times daily.     ferrous sulfate 325 (65 FE) MG tablet Take 1 tablet by mouth daily.     fluconazole (DIFLUCAN) 150 MG tablet Take 150 mg by mouth daily.     fluticasone (FLONASE) 50 MCG/ACT nasal spray Place 2 sprays into both nostrils daily.     folic acid (FOLVITE) 1 MG tablet Take 1 mg by mouth daily.     furosemide (LASIX) 20 MG tablet Take 20 mg by mouth daily as needed for fluid.     gabapentin (NEURONTIN) 600 MG tablet Take 600 mg by mouth 3 (three) times daily.     imatinib (GLEEVEC) 100 MG tablet Take 300 mg by mouth daily at 6  (six) AM.     losartan-hydrochlorothiazide (HYZAAR) 50-12.5 MG tablet Take 1 tablet by mouth daily.     magic mouthwash SOLN Take 5 mLs by mouth daily as needed for mouth pain.     nitroGLYCERIN (NITROSTAT) 0.4 MG SL tablet Place 1 tablet (0.4 mg total) under the tongue every 5 (five) minutes as needed for chest pain. 30 tablet 5   Omega-3 Fatty Acids (FISH OIL) 1000 MG CAPS Take 2,000 mg by mouth 2 (two) times daily.      ondansetron (ZOFRAN) 8 MG tablet Take 8 mg by mouth every 8 (eight) hours as needed for vomiting or nausea.     pantoprazole (PROTONIX) 40 MG tablet Take 40 mg by mouth daily.     PHOS-NAK 280-160-250 MG PACK Take 1 packet by mouth 2 (two) times daily.     polyethylene glycol (MIRALAX / GLYCOLAX) packet Take 17 g by mouth daily.     predniSONE (DELTASONE) 5 MG tablet Take 5 mg by mouth daily.     tamsulosin (FLOMAX) 0.4 MG CAPS capsule Take 0.4 mg by mouth daily.     terbinafine (LAMISIL) 1 % cream Apply 1 application topically 2 (two) times daily.     valACYclovir (VALTREX) 1000 MG tablet Take 1,000 mg by mouth daily.     No current facility-administered medications for this visit.     Past Surgical History:  Procedure Laterality Date   APPENDECTOMY  1987   CARDIAC CATHETERIZATION  2019   CATARACT EXTRACTION  11/2009 & 12/2015   CORONARY STENT INTERVENTION  06/28/2018   CORONARY STENT INTERVENTION N/A 06/28/2018   Procedure: CORONARY STENT INTERVENTION;  Surgeon: Nelva Bush, MD;  Location: [facility];  Service: Cardiovascular;  Laterality: N/A;   GALLBLADDER SURGERY  1989   HYDROCELE EXCISION Right 03/18/2021   Procedure: HYDROCELECTOMY ADULT;  Surgeon: Cleon Gustin, MD;  Location: [facility];  Service: Urology;  Laterality: Right;   INTRAVASCULAR ULTRASOUND/IVUS N/A 06/28/2018   Procedure: Intravascular Ultrasound/IVUS;  Surgeon: Nelva Bush, MD;  Location: [facility];  Service: Cardiovascular;  Laterality: N/A;   NASAL SINUS SURGERY   11/21/1987   RIGHT/LEFT HEART CATH AND CORONARY ANGIOGRAPHY N/A 06/28/2018   Procedure: RIGHT/LEFT HEART CATH AND CORONARY ANGIOGRAPHY;  Surgeon: Nelva Bush, MD;  Location: [facility];  Service: Cardiovascular;  Laterality: N/A;   SHOULDER SURGERY  2007   stent in lad  06/07/2004   TONSILLECTOMY  1971   trigerfinger  1990     Allergies  Allergen Reactions  Oxycodone Shortness Of Breath   Eggs Or Egg-Derived Products     Drainage in the back of throat   Flagyl [Metronidazole]     Couldn't urinate   Penicillins Rash    Has patient had a PCN reaction causing immediate rash, facial/tongue/throat swelling, SOB or lightheadedness with hypotension: Yes Has patient had a PCN reaction causing severe rash involving mucus membranes or skin necrosis: No Has patient had a PCN reaction that required hospitalization: No Has patient had a PCN reaction occurring within the last 10 years: No If all of the above answers are "NO", then may proceed with Cephalosporin use.'      Family History  Problem Relation Age of Onset   Heart attack Mother    Heart attack Father    Diabetes Father      Social History Mr. Schwertner reports that he has never smoked. He has never used smokeless tobacco. Mr. Gilkeson reports no history of alcohol use.   Review of Systems CONSTITUTIONAL: per hpi HEENT: Eyes: No visual loss, blurred vision, double vision or yellow sclerae.No hearing loss, sneezing, congestion, runny nose or sore throat.  SKIN: No rash or itching.  CARDIOVASCULAR: per hpi RESPIRATORY: per hpi GASTROINTESTINAL: No anorexia, nausea, vomiting or diarrhea. No abdominal pain or blood.  GENITOURINARY: No burning on urination, no polyuria NEUROLOGICAL: No headache, dizziness, syncope, paralysis, ataxia, numbness or tingling in the extremities. No change in bowel or bladder control.  MUSCULOSKELETAL: No muscle, back pain, joint pain or stiffness.  LYMPHATICS: No enlarged nodes. No history  of splenectomy.  PSYCHIATRIC: No history of depression or anxiety.  ENDOCRINOLOGIC: No reports of sweating, cold or heat intolerance. No polyuria or polydipsia.  Marland Kitchen   Physical Examination Today's Vitals   06/11/22 0813  BP: (!) 144/60  Pulse: 88  SpO2: 99%  Weight: 185 lb 9.6 oz (84.2 kg)  Height: '5\' 7"'$  (1.702 m)   Body mass index is 29.07 kg/m.  Gen: resting comfortably, no acute distress HEENT: no scleral icterus, pupils equal round and reactive, no palptable cervical adenopathy,  CV: RRR, 3/6 systolic murmur rusb, no jvd Resp: dry crackles bilaterally GI: abdomen is soft, non-tender, non-distended, normal bowel sounds, no hepatosplenomegaly MSK: extremities are warm, no edema.  Skin: warm, no rash Neuro:  no focal deficits Psych: appropriate affect   Diagnostic Studies 02/2020 echo [facility] Summary    1. Technically difficult study due to chest wall/lung interference.    2. The left ventricle is normal in size with normal wall thickness.    3. The left ventricular systolic function is normal, LVEF is visually  estimated at 60-65%.    4. Visually there is mild aortic valve stenosis.    5. The left atrium is mildly dilated in size.    6. The right ventricle is normal in size, with normal systolic function.    06/2018 cath Conclusions: Severe single-vessel coronary artery involving proximal and distal edges/segments of previously placed proximal LAD stent. Mild, non-obstructive CAD involving mid RCA. Normal left heart, right heart, and pulmonary artery pressures. Normal left ventricular systolic function and Fick cardiac output. Successful IVUS-guided PCI to proximal and mid LAD with placement of Synergy 2.5 x 16 mm and Synergy 2.25 x 20 mm drug-eluting stents.  Both stents overlap the previously placed Taxus stent in the proximal LAD.  Final angiogram shows 0% residual stenosis with TIMI-3 flow.   Recommendations: Aggressive secondary prevention. Overnight extended  recovery due to significant right arm/shoulder pain during and after catheterization  secondary to chronic arthritis.     02/2020 echo [facility] Summary    1. Technically difficult study due to chest wall/lung interference.    2. The left ventricle is normal in size with normal wall thickness.    3. The left ventricular systolic function is normal, LVEF is visually  estimated at 60-65%.    4. Visually there is mild aortic valve stenosis.    5. The left atrium is mildly dilated in size.    6. The right ventricle is normal in size, with normal systolic function.         07/2021 echo IMPRESSIONS     1. Left ventricular ejection fraction, by estimation, is 65 to 70%. The  left ventricle has normal function. The left ventricle has no regional  wall motion abnormalities. Left ventricular diastolic parameters are  consistent with Grade I diastolic  dysfunction (impaired relaxation).   2. Right ventricular systolic function is normal. The right ventricular  size is normal. Tricuspid regurgitation signal is inadequate for assessing  PA pressure.   3. The mitral valve is abnormal. Trivial mitral valve regurgitation.   4. The aortic valve is tricuspid with restricted motion of the left  coronary cusp. There is moderate calcification of the aortic valve. Aortic  valve regurgitation is not visualized. Mild to moderate aortic valve  stenosis. Aortic valve mean gradient  measures 14.0 mmHg. Dimentionless index 0.54.   5. The inferior vena cava is dilated in size with >50% respiratory  variability, suggesting right atrial pressure of 8 mmHg.      09/2021 nuclear stress Findings are consistent with no ischemia. The study is low risk.   No ST deviation was noted. The ECG was negative for ischemia.   LV perfusion is normal in the setting of extracardiac rib tracer uptake adjacent to the lateral wall.  No significant, reversible perfusion defect indicate ischemia.   Left ventricular function is normal.  Nuclear stress EF: 79 %.   Low risk study, no ischemic territories noted and LVEF 79%.        Assessment and Plan  1. CAD  - has been committed to indefinitie DAPT - recent stress test without ischemia - some ongoing chest tightness, increase norvasc as additional antianginal. With recent stress test and other advanced comorbidities would just treat medically at this time.     2. HTN -at goal, continue current meds   3. Hyperlipidemia -  troubles tolerating higher dose statin - LDL 1 year ago was at goal, request more cent labs with pcp   4. Aortic stenosis - from last year mild to moderate AS, numbers would align with more moderate with a lower gradient due to low SVI.  - given progression of DOE will repeat echo   5. SOB -ILD per pulmonary. Recent cardiac testing fairlty benign though we will recheck his valve       Arnoldo Lenis, M.D.

## 2022-06-11 NOTE — Patient Instructions (Signed)
Medication Instructions:  Your physician has recommended you make the following change in your medication:  Increase amlodipine to 5 mg once a day Continue all other medications as directed  Labwork: none  Testing/Procedures: Your physician has requested that you have an echocardiogram. Echocardiography is a painless test that uses sound waves to create images of your heart. It provides your doctor with information about the size and shape of your heart and how well your heart's chambers and valves are working. This procedure takes approximately one hour. There are no restrictions for this procedure.   Follow-Up:  Your physician recommends that you schedule a follow-up appointment in: 6 months  Any Other Special Instructions Will Be Listed Below (If Applicable).  If you need a refill on your cardiac medications before your next appointment, please call your pharmacy.

## 2022-06-17 ENCOUNTER — Telehealth: Payer: Self-pay | Admitting: Cardiology

## 2022-06-17 NOTE — Telephone Encounter (Signed)
Preop callback team, will you please notify patient that clearance has been sent and he may hold Plavix from now until the procedure.   Thank you, Tatijana Bierly

## 2022-06-17 NOTE — Telephone Encounter (Signed)
Patient called wanting to know if the request to hold Plavix has been sent.

## 2022-06-17 NOTE — Telephone Encounter (Signed)
Ok to hold plavix as needed, ok to proceed with procedure   Zandra Abts MD

## 2022-06-17 NOTE — Telephone Encounter (Signed)
  Primary Cardiologist: Carlyle Dolly, MD  Chart reviewed as part of pre-operative protocol coverage. Given past medical history and time since last visit, based on ACC/AHA guidelines, Lonnie Duffy would be at acceptable risk for the planned procedure without further cardiovascular testing.   He may hold Plavix for 7 days prior to procedure and should resume Plavix as soon as hemodynamically stable following the procedure.  I will route this recommendation to the requesting party via Epic fax function and remove from pre-op pool.  Please call with questions.  Emmaline Life, NP-C    06/17/2022, 4:28 PM [address], [address] Office [phone] Fax [phone]

## 2022-06-17 NOTE — Telephone Encounter (Signed)
Patient calling for results to his echo

## 2022-06-17 NOTE — Telephone Encounter (Signed)
Patient made aware, verbalized understanding

## 2022-06-17 NOTE — Telephone Encounter (Signed)
Pt aware he has been cleared and ok to hold Plavix from now until procedure. Pt aware to resume Plavix once Dr. Davy Pique feels it is safe.

## 2022-06-18 NOTE — Telephone Encounter (Signed)
Clearance notes have been re-faxed again today to fax # [phone].

## 2022-06-18 NOTE — Telephone Encounter (Signed)
[facility] is requesting the clearance for this and medication sent to them at: Fax # [phone]

## 2022-07-09 ENCOUNTER — Encounter: Payer: Self-pay | Admitting: Pulmonary Disease

## 2022-07-09 ENCOUNTER — Ambulatory Visit: Payer: Medicare Other | Admitting: Pulmonary Disease

## 2022-07-09 VITALS — BP 120/62 | HR 88 | Ht 67.0 in | Wt 181.8 lb

## 2022-07-09 DIAGNOSIS — J849 Interstitial pulmonary disease, unspecified: Secondary | ICD-10-CM | POA: Diagnosis not present

## 2022-07-09 DIAGNOSIS — M069 Rheumatoid arthritis, unspecified: Secondary | ICD-10-CM

## 2022-07-09 NOTE — Patient Instructions (Signed)
We will repeat a high resolution CT chest scan at [facility]  I will review your case with my partners and Leafy Kindle of Rheumatology whether starting a medication for the lung inflammation is safe  We will schedule you for follow up with pulmonary function tests in about 1 month

## 2022-07-09 NOTE — Progress Notes (Signed)
Synopsis: Referred by Gar Ponto for shortness of breath  Subjective:   PATIENT ID: Lonnie Duffy GENDER: male DOB: [birth year], MRN: [id number]  HPI  Chief Complaint  Patient presents with   Follow-up    F/U for SOB. States he has been dealing with SOB for the past few months. Was cleared by cardiology, increased his furosemide. This slightly improved his SOB.    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  He reports increasing dyspnea over recent months but has improved with lasix from his cardiology team. He does have lower extremity edema.  He started enbrel recently for his RA with significant improvement in his joint pain. He is on '5mg'$  prednisone daily.   OV 10/21/21 He remains short of breath with exertion with easy fatigability which remains about the same since last visit. He is not using nebulizer treatments as this gave him chest pains. He is working with his Rheumatology team for a maintenance regimen he can tolerate as he had reactions to sulfasalazine and leflunomide. He is currently taking '5mg'$  prednisone daily with on going joint issues.  HRCT Chest 10/18/21 does show some mild progression of peripheral predominant ground glass attenuation with subpleural reticulation  He reports elevated HR which he has noticed running in the 90s to low 100s in the mornings.  OV 05/24/21 He has been doing well since last visit visit. He continues to have significant shortness of breath with activity and increased fatigue with activity. The anoro made his cough worse. He is requesting a nebulizer machine and nebulizer treatments as he felt these have helped in the past with his breathing.  He is now seeing a rheumatologist for his RA. He remains on '15mg'$  daily and will be tapering down in the future with the addition of another RA treatment.  PFTs  today show FEV1/FVC 87%, FEV1 2.06L (80%), FVC 2.37L (66%) and DLCO 15.56 (69%), relatively stable since 09/2020.   OV 08/21/20 He was diagnosed with rheumatoid arthritis 8 years ago and has been managed on methotrexate and '5mg'$  of prednisone daily for the joint pain.   The patient was diagnosed with CML in 01/2020 and was started on Dasatinib in 02/2020. By the end of May he was noted to be having increasing shortness of breath and wheezing. A CT chest scan 03/02/20 showed bilateral peripherally predominant patchy areas of ground glass attentuation, interstitial thickening and reticulation with mild traction bronchiectasis. Cardiac echo at this time showed EF 60-65%, mild aortic stenosis and mild left atrial dilation with normal RV function. 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  He had initial improvement in his breathing with the steroid taper but he continued to experience exertional shortness of breath with work. He was first seen in pulmonary clinic 07/03/20 and reports his breathing has been the same since then with no further improvement. He has remained on '10mg'$  of prednisone daily for the rheumatoid arthritis, which he has been experiencing pain in his hands.   He was last seen by hematology/oncology on 08/02/20. He is currently on '200mg'$  of Imatinib daily for the CML as he had an increase in his WBC count at that time. He continues to have some mucositis symptoms which are maintained with magic mouth wash.    Pulmonary Function testing today  shows FEV1/FVC 88, FEV1 2.22L (86%), FVC 2.53L (70%), TLC 5.2L (82%), DLCO 14.99 (67%)  Past Medical History:  Diagnosis Date   ASCVD (arteriosclerotic cardiovascular disease)    Asthma    Chest pain    Collagen vascular disease (HCC)    Complication of anesthesia    "I had local anesthesia , and I went out for 3 hrs "   Connective tissue disorder (HCC)    COPD  (chronic obstructive pulmonary disease) ([place])    Coronary artery disease    Diabetes mellitus without complication (HCC)    Dyspnea    GERD (gastroesophageal reflux disease)    Hypertension    Leukemia ([place])    Palpitations    Pneumonia 04/2020   Renal insufficiency      Family History  Problem Relation Age of Onset   Heart attack Mother    Heart attack Father    Diabetes Father      Social History   Socioeconomic History   Marital status: Married    Spouse name: Not on file   Number of children: Not on file   Years of education: Not on file   Highest education level: Not on file  Occupational History   Not on file  Tobacco Use   Smoking status: Never   Smokeless tobacco: Never  Vaping Use   Vaping Use: Never used  Substance and Sexual Activity   Alcohol use: Never   Drug use: Never   Sexual activity: Not on file  Other Topics Concern   Not on file  Social History Narrative   Not on file   Social Determinants of Health   Financial Resource Strain: Not on file  Food Insecurity: Not on file  Transportation Needs: Not on file  Physical Activity: Not on file  Stress: Not on file  Social Connections: Not on file  Intimate Partner Violence: Not on file     Allergies  Allergen Reactions   Oxycodone Shortness Of Breath   Corn-Containing Products Other (See Comments)    Per patient, causes increased drainage.    Eggs Or Egg-Derived Products     Drainage in the back of throat   Flagyl [Metronidazole]     Couldn't urinate   Sulfasalazine Other (See Comments)   Penicillins Rash    Has patient had a PCN reaction causing immediate rash, facial/tongue/throat swelling, SOB or lightheadedness with hypotension: Yes Has patient had a PCN reaction causing severe rash involving mucus membranes or skin necrosis: No Has patient had a PCN reaction that required hospitalization: No Has patient had a PCN reaction occurring within the last 10 years: No If all of the above  answers are "NO", then may proceed with Cephalosporin use.'     Outpatient Medications Prior to Visit  Medication Sig Dispense Refill   acetaminophen (TYLENOL) 500 MG tablet Take 1,000 mg by mouth every 6 (six) hours as needed for moderate pain.     amLODipine (NORVASC) 5 MG tablet Take 1 tablet (5 mg total) by mouth daily. 90 tablet 1   aspirin EC 81 MG tablet Take 162 mg by mouth daily.     atorvastatin (LIPITOR) 10 MG tablet Take 10 mg by mouth at bedtime.      Cholecalciferol (VITAMIN D3) 5000 units TABS Take 5,000 Units by mouth 3 (three) times a week.     clopidogrel (PLAVIX) 75 MG tablet Take 75 mg by mouth daily.     cyanocobalamin 1000 MCG tablet Take 3,000 mcg by mouth  daily.     cyclobenzaprine (FLEXERIL) 10 MG tablet Take 10 mg by mouth 2 (two) times daily.     etanercept (ENBREL) 50 MG/ML injection Inject 50 mg into the skin once a week.     ferrous sulfate 325 (65 FE) MG tablet Take 1 tablet by mouth daily.     fluconazole (DIFLUCAN) 150 MG tablet Take 150 mg by mouth daily.     folic acid (FOLVITE) 1 MG tablet Take 1 mg by mouth daily.     furosemide (LASIX) 20 MG tablet Take 20 mg by mouth daily as needed for fluid.     gabapentin (NEURONTIN) 600 MG tablet Take 600 mg by mouth 3 (three) times daily.     imatinib (GLEEVEC) 100 MG tablet Take 300 mg by mouth daily at 6 (six) AM.     losartan-hydrochlorothiazide (HYZAAR) 100-12.5 MG tablet Take 1 tablet by mouth daily.     magic mouthwash SOLN Take 5 mLs by mouth daily as needed for mouth pain.     nitroGLYCERIN (NITROSTAT) 0.4 MG SL tablet Place 1 tablet (0.4 mg total) under the tongue every 5 (five) minutes as needed for chest pain. 30 tablet 5   Omega-3 Fatty Acids (FISH OIL) 1000 MG CAPS Take 2,000 mg by mouth 2 (two) times daily.      ondansetron (ZOFRAN) 8 MG tablet Take 8 mg by mouth every 8 (eight) hours as needed for vomiting or nausea.     pantoprazole (PROTONIX) 40 MG tablet Take 40 mg by mouth daily.     PHOS-NAK  280-160-250 MG PACK Take 1 packet by mouth 2 (two) times daily.     polyethylene glycol (MIRALAX / GLYCOLAX) packet Take 17 g by mouth daily.     predniSONE (DELTASONE) 5 MG tablet Take 5 mg by mouth daily.     tamsulosin (FLOMAX) 0.4 MG CAPS capsule Take 0.4 mg by mouth daily.     terbinafine (LAMISIL) 1 % cream Apply 1 application topically 2 (two) times daily.     valACYclovir (VALTREX) 1000 MG tablet Take 1,000 mg by mouth daily.     fluticasone (FLONASE) 50 MCG/ACT nasal spray Place 2 sprays into both nostrils daily.     No facility-administered medications prior to visit.   Review of Systems  Constitutional:  Positive for malaise/fatigue. Negative for chills, diaphoresis, fever and weight loss.  HENT:  Negative for congestion, nosebleeds, sinus pain and sore throat.   Eyes:  Negative for blurred vision.  Respiratory:  Positive for shortness of breath. Negative for cough, hemoptysis, sputum production and wheezing.   Cardiovascular:  Negative for chest pain, palpitations, orthopnea, claudication, leg swelling and PND.  Gastrointestinal:  Negative for abdominal pain, blood in stool, diarrhea, heartburn, melena, nausea and vomiting.  Genitourinary:  Negative for dysuria, frequency and hematuria.  Musculoskeletal:  Positive for joint pain. Negative for myalgias.  Skin:  Negative for itching and rash.  Neurological:  Negative for dizziness, weakness and headaches.  Endo/Heme/Allergies:  Does not bruise/bleed easily.  Psychiatric/Behavioral: Negative.      Objective:   Vitals:   07/09/22 1124  BP: 120/62  Pulse: 88  SpO2: 100%  Weight: 181 lb 12.8 oz (82.5 kg)  Height: '5\' 7"'$  (1.702 m)   Physical Exam Constitutional:      General: He is not in acute distress.    Appearance: Normal appearance. He is normal weight. He is not ill-appearing.  HENT:     Head: Normocephalic and atraumatic.  Nose: Nose normal.     Mouth/Throat:     Mouth: Mucous membranes are moist.      Pharynx: Oropharynx is clear.  Cardiovascular:     Rate and Rhythm: Normal rate and regular rhythm.     Pulses: Normal pulses.     Heart sounds: Murmur (systolic) heard.  Pulmonary:     Effort: Pulmonary effort is normal. No respiratory distress.     Breath sounds: Examination of the left-middle field reveals rales. Rales (dry, bibasilar) present. No wheezing or rhonchi.  Musculoskeletal:     Right lower leg: No edema.     Left lower leg: No edema.  Skin:    General: Skin is warm and dry.     Capillary Refill: Capillary refill takes less than 2 seconds.  Neurological:     General: No focal deficit present.     Mental Status: He is alert and oriented to person, place, and time. Mental status is at baseline.     Gait: Gait normal.     CBC    Component Value Date/Time   WBC 7.4 06/29/2018 0247   RBC 3.79 (L) 06/29/2018 0247   HGB 11.6 (L) 06/29/2018 0247   HCT 34.9 (L) 06/29/2018 0247   PLT 237 06/29/2018 0247   MCV 92.1 06/29/2018 0247   MCH 30.6 06/29/2018 0247   MCHC 33.2 06/29/2018 0247   RDW 13.4 06/29/2018 0247   Chest imaging: HRCT Chest 10/18/21 1. Progressively worsening interstitial lung disease with imaging findings considered probable usual interstitial pneumonia (UIP) per current ATS guidelines. Given the progression compared to the prior study, UIP is strongly favored. 2. Aortic atherosclerosis, in addition to left main and three-vessel coronary artery disease. Assessment for potential risk factor modification, dietary therapy or pharmacologic therapy may be warranted, if clinically indicated. 3. There are calcifications of the aortic valve and mitral annulus. Echocardiographic correlation for evaluation of potential valvular dysfunction may be warranted if clinically indicated. 4. Mild hepatic steatosis.  09/14/20 HRCT Chest Mediastinum/Nodes: No pathologically enlarged mediastinal or hilar lymph nodes. Please note that accurate exclusion of hilar  adenopathy is limited on noncontrast CT scans. Esophagus is unremarkable in appearance. No axillary lymphadenopathy.   Lungs/Pleura: High-resolution images demonstrate patchy areas of peripheral predominant ground-glass attenuation, septal thickening, subpleural reticulation, parenchymal banding, cylindrical bronchiectasis and peripheral bronchiolectasis. No definitive honeycombing identified. Inspiratory and expiratory imaging is unremarkable. No acute consolidative airspace disease. No pleural effusions. Calcified granuloma in the right middle lobe. Previously noted 3 mm right upper lobe nodule has completely resolved. No definite suspicious appearing pulmonary nodules or masses are noted.  03/02/20 CT Chest: 1. Bilateral peripheral predominant patchy areas of ground-glass  attenuation, interstitial thickening and reticulation with mild  traction bronchiectasis. Findings compatible with interstitial lung  disease, favor hypersensitivity pneumonitis. Cannot rule out drug  toxicity. No frank honeycombing identified at this point. Recommend  follow-up imaging in 6 months with high-resolution CT of the chest  to assess for any temporal changes in the appearance of the lungs.  2. 3 mm right upper lobe lung nodule. No follow-up needed if patient  is low-risk. Non-contrast chest CT can be considered in 12 months if  patient is high-risk.  3. 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  4. Aortic atherosclerosis, in addition to 3 vessel coronary artery  disease. Please note that although the presence of coronary artery  calcium documents the presence of coronary artery  disease, the  severity of this disease and any potential stenosis cannot be  assessed on this non-gated CT examination. Assessment for potential  risk factor modification, dietary therapy or pharmacologic therapy   may be warranted, if clinically indicated.   CXR 03/27/20 Bilateral interstitial prominence again noted. Interstitial changes  are less prominent than noted on prior CT of 03/02/2020  CXR 04/15/20 Lungs are hypoinflated with  stable elevation of the left hemidiaphragm. There is interval  worsening of patchy bilateral hazy mixed interstitial airspace  density. Note that a patchy bilateral ground-glass process was noted  on the recent previous CT. Linear atelectasis over the left  perihilar region. No effusion. Cardiomediastinal silhouette is  normal. Remainder of the exam is unchanged.   PFT: 2022: FEV1/FVC 87%, FEV1 2.06L (80%), FVC 2.37L (66%) and DLCO 15.56 (69%)  2021: FEV1/FVC 88, FEV1 2.22L (86%), FVC 2.53L (70%), TLC 5.2L (82%), DLCO 14.99 (67%)   Labs: 06/21/20 CBC: WBC 47.9, HGB 12, HCT 38.2, Plt 895 06/21/20 BMP: Na 135, K 3.9, Cl 99, Co2 26.8, Cr 1.56, BUN 18   08/02/20 CBC: WBC 32.2, HGB 11.7, HCT 36.3, PLT 983 08/02/20 BMP: Na 133, K 4.6, Cl 95, Co2 29.7, Cr 1.72, BUN 8   Echo: 05/2022 LV EF 70-75%. RV size and systolic function is normal. Moderate mitral stenosis. Moderate to severe mitral annular calcification. Moderate calcification of the aortic valve. Mild to moderate aortic stenosis. Peak gradient 62mHg.    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  His dyspnea is slightly progressed compared to last visit which has improved somewhat with diuresis for his heart failure.   We will repeat pulmonary function tests and HRCT Chest.   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 Given his underlying rheumatoid arthritis this could be UIP in setting of RA with potential treatment options to include cell cept and antifibrotics. I will discuss his case at ILD conference for further recommendations on potential treatment options. Overall we may continue to monitor his lung disease given his underlying issues with CML on gleevac and not expose him to side effects of cell cept and antifibrotics.  Follow up in 1 month with PFTs.  JFreda Jackson MD [facility] Office: [phone]   Current Outpatient Medications:    acetaminophen (TYLENOL) 500 MG tablet, Take 1,000 mg by mouth every 6 (six) hours as needed for moderate pain., Disp: , Rfl:    amLODipine (NORVASC) 5 MG tablet, Take 1 tablet (5 mg total) by mouth daily., Disp: 90 tablet, Rfl: 1   aspirin EC 81 MG tablet, Take 162 mg by mouth daily., Disp: , Rfl:    atorvastatin (LIPITOR) 10 MG tablet, Take 10 mg by mouth at bedtime. , Disp: , Rfl:    Cholecalciferol (VITAMIN D3) 5000 units TABS, Take 5,000 Units by mouth 3 (three) times a week., Disp: , Rfl:    clopidogrel (PLAVIX) 75 MG tablet, Take 75 mg by mouth daily., Disp: , Rfl:    cyanocobalamin 1000 MCG tablet, Take 3,000 mcg by mouth daily.,  Disp: , Rfl:    cyclobenzaprine (FLEXERIL) 10 MG tablet, Take 10 mg by mouth 2 (two) times daily., Disp: , Rfl:    etanercept (ENBREL) 50 MG/ML injection, Inject 50 mg into the skin once a week., Disp: , Rfl:    ferrous sulfate 325 (65 FE) MG tablet, Take 1 tablet by mouth daily., Disp: , Rfl:    fluconazole (DIFLUCAN) 150 MG tablet, Take 150 mg by mouth daily., Disp: , Rfl:    folic acid (FOLVITE) 1 MG tablet, Take 1 mg by mouth daily., Disp: , Rfl:    furosemide (LASIX) 20 MG tablet, Take 20 mg by mouth daily as needed for fluid., Disp: , Rfl:    gabapentin (NEURONTIN) 600  MG tablet, Take 600 mg by mouth 3 (three) times daily., Disp: , Rfl:    imatinib (GLEEVEC) 100 MG tablet, Take 300 mg by mouth daily at 6 (six) AM., Disp: , Rfl:    losartan-hydrochlorothiazide (HYZAAR) 100-12.5 MG tablet, Take 1 tablet by mouth daily., Disp: , Rfl:    magic mouthwash SOLN, Take 5 mLs by mouth daily as needed for mouth pain., Disp: , Rfl:    nitroGLYCERIN (NITROSTAT) 0.4 MG SL tablet, Place 1 tablet (0.4 mg total) under the tongue every 5 (five) minutes as needed for chest pain., Disp: 30 tablet, Rfl: 5   Omega-3 Fatty Acids (FISH OIL) 1000 MG CAPS, Take 2,000 mg by mouth 2 (two) times daily. , Disp: , Rfl:    ondansetron (ZOFRAN) 8 MG tablet, Take 8 mg by mouth every 8 (eight) hours as needed for vomiting or nausea., Disp: , Rfl:    pantoprazole (PROTONIX) 40 MG tablet, Take 40 mg by mouth daily., Disp: , Rfl:    PHOS-NAK 280-160-250 MG PACK, Take 1 packet by mouth 2 (two) times daily., Disp: , Rfl:    polyethylene glycol (MIRALAX / GLYCOLAX) packet, Take 17 g by mouth daily., Disp: , Rfl:    predniSONE (DELTASONE) 5 MG tablet, Take 5 mg by mouth daily., Disp: , Rfl:    tamsulosin (FLOMAX) 0.4 MG CAPS capsule, Take 0.4 mg by mouth daily., Disp: , Rfl:    terbinafine (LAMISIL) 1 % cream, Apply 1 application topically 2 (two) times daily., Disp: , Rfl:    valACYclovir (VALTREX) 1000 MG tablet, Take 1,000 mg by mouth daily., Disp: , Rfl:

## 2022-07-10 ENCOUNTER — Telehealth: Payer: Self-pay | Admitting: Pulmonary Disease

## 2022-07-10 NOTE — Telephone Encounter (Signed)
Lonnie Duffy called back from [facility] and states the same order was sent again. Order needs to specify room air or what the setting needs to be.  Please advise.

## 2022-07-11 NOTE — Addendum Note (Signed)
Addended by: Vanessa Barbara on: 07/11/2022 02:53 PM   Modules accepted: Orders

## 2022-07-11 NOTE — Telephone Encounter (Signed)
Patient is not on oxygen at this time.  LVM for Lonnie Duffy at [facility] to let her know I had put in a new order for the ONO to be done on RA.  Advised to call back with any questions.

## 2022-07-17 ENCOUNTER — Telehealth: Payer: Self-pay | Admitting: *Deleted

## 2022-07-17 ENCOUNTER — Other Ambulatory Visit: Payer: Self-pay | Admitting: *Deleted

## 2022-07-17 DIAGNOSIS — J849 Interstitial pulmonary disease, unspecified: Secondary | ICD-10-CM

## 2022-07-17 NOTE — Telephone Encounter (Signed)
Received a message from the front staff stating that Lonnie Duffy at [facility] did not receive the order/fax for ONO on RA.  Placed another order.

## 2022-07-17 NOTE — Telephone Encounter (Signed)
PCCs, can we resend this order to [facility]?

## 2022-07-18 NOTE — Telephone Encounter (Signed)
Order has been resent to [facility]. Nothing further needed at this time

## 2022-07-25 ENCOUNTER — Telehealth: Payer: Self-pay | Admitting: Pulmonary Disease

## 2022-07-25 NOTE — Telephone Encounter (Signed)
Received ONO from [place]. Report has been placed in JD's review folder.

## 2022-08-20 NOTE — Telephone Encounter (Signed)
Patient does qualify for supplemental oxygen as he spent 18 minutes with an oxygen saturation of 88% or less. He is to start 2L of Oxgyen with humidification. Please place orders if he is amenable to starting nocturnal oxygen therapy.  Thanks, JD

## 2022-08-22 ENCOUNTER — Ambulatory Visit (HOSPITAL_COMMUNITY)
Admission: RE | Admit: 2022-08-22 | Discharge: 2022-08-22 | Disposition: A | Discharge status: Home / Self Care | Payer: Medicare Other | Source: Ambulatory Visit | Attending: Pulmonary Disease | Admitting: Pulmonary Disease

## 2022-08-22 DIAGNOSIS — J849 Interstitial pulmonary disease, unspecified: Secondary | ICD-10-CM | POA: Insufficient documentation

## 2022-08-22 NOTE — Telephone Encounter (Signed)
Called and spoke with patient. He verbalized understanding of results. He stated that he does not want to start oxygen at this time. He has an appt with Dr. Erin Fulling on 11/16 and will discuss the oxygen then.   Nothing further needed at time of call.

## 2022-08-26 ENCOUNTER — Ambulatory Visit: Payer: Self-pay | Admitting: Pulmonary Disease

## 2022-08-28 ENCOUNTER — Ambulatory Visit: Payer: Medicare Other | Admitting: Pulmonary Disease

## 2022-08-28 ENCOUNTER — Ambulatory Visit (INDEPENDENT_AMBULATORY_CARE_PROVIDER_SITE_OTHER): Payer: Medicare Other | Admitting: Pulmonary Disease

## 2022-08-28 ENCOUNTER — Encounter: Payer: Self-pay | Admitting: Pulmonary Disease

## 2022-08-28 VITALS — BP 134/70 | HR 91 | Ht 66.5 in | Wt 183.2 lb

## 2022-08-28 DIAGNOSIS — M7989 Other specified soft tissue disorders: Secondary | ICD-10-CM | POA: Diagnosis not present

## 2022-08-28 DIAGNOSIS — J849 Interstitial pulmonary disease, unspecified: Secondary | ICD-10-CM

## 2022-08-28 DIAGNOSIS — M069 Rheumatoid arthritis, unspecified: Secondary | ICD-10-CM | POA: Diagnosis not present

## 2022-08-28 LAB — PULMONARY FUNCTION TEST
DL/VA % pred: 99 %
DL/VA: 3.93 ml/min/mmHg/L
DLCO cor % pred: 76 %
DLCO cor: 16.78 ml/min/mmHg
DLCO unc % pred: 63 %
DLCO unc: 13.89 ml/min/mmHg
FEF 25-75 Post: 3.77 L/sec
FEF 25-75 Pre: 3.43 L/sec
FEF2575-%Change-Post: 9 %
FEF2575-%Pred-Post: 220 %
FEF2575-%Pred-Pre: 201 %
FEV1-%Change-Post: 1 %
FEV1-%Pred-Post: 85 %
FEV1-%Pred-Pre: 84 %
FEV1-Post: 2.12 L
FEV1-Pre: 2.09 L
FEV1FVC-%Change-Post: 1 %
FEV1FVC-%Pred-Pre: 122 %
FEV6-%Change-Post: 0 %
FEV6-%Pred-Post: 73 %
FEV6-%Pred-Pre: 73 %
FEV6-Post: 2.37 L
FEV6-Pre: 2.38 L
FEV6FVC-%Change-Post: 0 %
FEV6FVC-%Pred-Post: 107 %
FEV6FVC-%Pred-Pre: 107 %
FVC-%Change-Post: 0 %
FVC-%Pred-Post: 67 %
FVC-%Pred-Pre: 68 %
FVC-Post: 2.37 L
FVC-Pre: 2.38 L
Post FEV1/FVC ratio: 89 %
Post FEV6/FVC ratio: 100 %
Pre FEV1/FVC ratio: 88 %
Pre FEV6/FVC Ratio: 100 %
RV % pred: 91 %
RV: 2.25 L
TLC % pred: 74 %
TLC: 4.71 L

## 2022-08-28 NOTE — Patient Instructions (Addendum)
Your breathing tests show mild restriction  Your CT Chest scan shows stable inflammation/scarring on the lungs  We discussed your case at the interstitial lung disease conference and we can consider starting a medication called CellCept (mycophenolate mofetil) to decrease the inflammation and maintain your lung function as best as possible  We will check lower extremity ultra sounds to rule out blood clots due to your swelling  Follow up in 4 months

## 2022-08-28 NOTE — Progress Notes (Signed)
PFT done today. 

## 2022-08-28 NOTE — Progress Notes (Signed)
Synopsis: Referred by Gar Ponto for shortness of breath  Subjective:   PATIENT ID: Lonnie Duffy GENDER: male DOB: 04-10-1941, MRN: [id number]  HPI  Chief Complaint  Patient presents with   Follow-up    F/U after PFT and CT.    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  PFTs today show mild restriction and mild diffusion defect.  HRCT Chest does not indicate significant changes in his interstitial lung disease.  He took RSV and covid vaccine yesterday  He stopped enbrel due to lower extremity swelling. Joint pain is not bad. He remains on prednisone '5mg'$  daily.   OV 07/09/22 He reports increasing dyspnea over recent months but has improved with lasix from his cardiology team. He does have lower extremity edema.  He started enbrel recently for his RA with significant improvement in his joint pain. He is on '5mg'$  prednisone daily.   OV 10/21/21 He remains short of breath with exertion with easy fatigability which remains about the same since last visit. He is not using nebulizer treatments as this gave him chest pains. He is working with his Rheumatology team for a maintenance regimen he can tolerate as he had reactions to sulfasalazine and leflunomide. He is currently taking '5mg'$  prednisone daily with on going joint issues.  HRCT Chest 10/18/21 does show some mild progression of peripheral predominant ground glass attenuation with subpleural reticulation  He reports elevated HR which he has noticed running in the 90s to low 100s in the mornings.  OV 05/24/21 He has been doing well since last visit visit. He continues to have significant shortness of breath with activity and increased fatigue with activity. The anoro made his cough worse. He is requesting a nebulizer machine and nebulizer treatments as he felt these have helped in the past with  his breathing.  He is now seeing a rheumatologist for his RA. He remains on '15mg'$  daily and will be tapering down in the future with the addition of another RA treatment.  PFTs today show FEV1/FVC 87%, FEV1 2.06L (80%), FVC 2.37L (66%) and DLCO 15.56 (69%), relatively stable since 09/2020.   OV 08/21/20 He was diagnosed with rheumatoid arthritis 8 years ago and has been managed on methotrexate and '5mg'$  of prednisone daily for the joint pain.   The patient was diagnosed with CML in 01/2020 and was started on Dasatinib in 02/2020. By the end of May he was noted to be having increasing shortness of breath and wheezing. A CT chest scan 03/02/20 showed bilateral peripherally predominant patchy areas of ground glass attentuation, interstitial thickening and reticulation with mild traction bronchiectasis. Cardiac echo at this time showed EF 60-65%, mild aortic stenosis and mild left atrial dilation with normal RV function. 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  He had initial improvement in his breathing with the steroid taper but he continued to experience exertional shortness of breath with work. He was first seen in pulmonary clinic 07/03/20 and reports his breathing has been the same since then with no further improvement. He has remained on '10mg'$  of prednisone daily for the rheumatoid arthritis, which he has been experiencing pain in his hands.   He was last seen by hematology/oncology on 08/02/20. He is currently on '200mg'$  of Imatinib daily for the CML as  he had an increase in his WBC count at that time. He continues to have some mucositis symptoms which are maintained with magic mouth wash.    Pulmonary Function testing today shows FEV1/FVC 88, FEV1 2.22L (86%), FVC 2.53L (70%), TLC 5.2L (82%), DLCO 14.99 (67%)  Past Medical History:  Diagnosis Date   ASCVD (arteriosclerotic cardiovascular disease)    Asthma    Chest  pain    Collagen vascular disease (HCC)    Complication of anesthesia    "I had local anesthesia , and I went out for 3 hrs "   Connective tissue disorder (HCC)    COPD (chronic obstructive pulmonary disease) ([place])    Coronary artery disease    Diabetes mellitus without complication (HCC)    Dyspnea    GERD (gastroesophageal reflux disease)    Hypertension    Leukemia ([place])    Palpitations    Pneumonia 04/2020   Renal insufficiency      Family History  Problem Relation Age of Onset   Heart attack Mother    Heart attack Father    Diabetes Father      Social History   Socioeconomic History   Marital status: Married    Spouse name: Not on file   Number of children: Not on file   Years of education: Not on file   Highest education level: Not on file  Occupational History   Not on file  Tobacco Use   Smoking status: Never   Smokeless tobacco: Never  Vaping Use   Vaping Use: Never used  Substance and Sexual Activity   Alcohol use: Never   Drug use: Never   Sexual activity: Not on file  Other Topics Concern   Not on file  Social History Narrative   Not on file   Social Determinants of Health   Financial Resource Strain: Not on file  Food Insecurity: Not on file  Transportation Needs: Not on file  Physical Activity: Not on file  Stress: Not on file  Social Connections: Not on file  Intimate Partner Violence: Not on file     Allergies  Allergen Reactions   Oxycodone Shortness Of Breath   Corn-Containing Products Other (See Comments)    Per patient, causes increased drainage.    Eggs Or Egg-Derived Products     Drainage in the back of throat   Flagyl [Metronidazole]     Couldn't urinate   Sulfasalazine Other (See Comments)   Penicillins Rash    Has patient had a PCN reaction causing immediate rash, facial/tongue/throat swelling, SOB or lightheadedness with hypotension: Yes Has patient had a PCN reaction causing severe rash involving mucus membranes or skin  necrosis: No Has patient had a PCN reaction that required hospitalization: No Has patient had a PCN reaction occurring within the last 10 years: No If all of the above answers are "NO", then may proceed with Cephalosporin use.'     Outpatient Medications Prior to Visit  Medication Sig Dispense Refill   acetaminophen (TYLENOL) 500 MG tablet Take 1,000 mg by mouth every 6 (six) hours as needed for moderate pain.     amLODipine (NORVASC) 5 MG tablet Take 1 tablet (5 mg total) by mouth daily. 90 tablet 1   aspirin EC 81 MG tablet Take 162 mg by mouth daily.     atorvastatin (LIPITOR) 10 MG tablet Take 10 mg by mouth at bedtime.      Cholecalciferol (VITAMIN D3) 5000 units TABS Take 5,000 Units by mouth 3 (  three) times a week.     clopidogrel (PLAVIX) 75 MG tablet Take 75 mg by mouth daily.     cyanocobalamin 1000 MCG tablet Take 3,000 mcg by mouth daily.     cyclobenzaprine (FLEXERIL) 10 MG tablet Take 10 mg by mouth 2 (two) times daily.     ferrous sulfate 325 (65 FE) MG tablet Take 1 tablet by mouth daily.     fluconazole (DIFLUCAN) 150 MG tablet Take 150 mg by mouth daily.     folic acid (FOLVITE) 1 MG tablet Take 1 mg by mouth daily.     furosemide (LASIX) 20 MG tablet Take 20 mg by mouth daily as needed for fluid.     gabapentin (NEURONTIN) 600 MG tablet Take 600 mg by mouth 3 (three) times daily.     imatinib (GLEEVEC) 100 MG tablet Take 300 mg by mouth daily at 6 (six) AM.     losartan-hydrochlorothiazide (HYZAAR) 100-12.5 MG tablet Take 1 tablet by mouth daily.     magic mouthwash SOLN Take 5 mLs by mouth daily as needed for mouth pain.     nitroGLYCERIN (NITROSTAT) 0.4 MG SL tablet Place 1 tablet (0.4 mg total) under the tongue every 5 (five) minutes as needed for chest pain. 30 tablet 5   Omega-3 Fatty Acids (FISH OIL) 1000 MG CAPS Take 2,000 mg by mouth 2 (two) times daily.      ondansetron (ZOFRAN) 8 MG tablet Take 8 mg by mouth every 8 (eight) hours as needed for vomiting or  nausea.     pantoprazole (PROTONIX) 40 MG tablet Take 40 mg by mouth daily.     PHOS-NAK 280-160-250 MG PACK Take 1 packet by mouth 2 (two) times daily.     polyethylene glycol (MIRALAX / GLYCOLAX) packet Take 17 g by mouth daily.     predniSONE (DELTASONE) 5 MG tablet Take 5 mg by mouth daily.     tamsulosin (FLOMAX) 0.4 MG CAPS capsule Take 0.4 mg by mouth daily.     terbinafine (LAMISIL) 1 % cream Apply 1 application topically 2 (two) times daily.     valACYclovir (VALTREX) 1000 MG tablet Take 1,000 mg by mouth daily.     etanercept (ENBREL) 50 MG/ML injection Inject 50 mg into the skin once a week.     No facility-administered medications prior to visit.   Review of Systems  Constitutional:  Positive for malaise/fatigue. Negative for chills, diaphoresis, fever and weight loss.  HENT:  Negative for congestion, nosebleeds, sinus pain and sore throat.   Eyes:  Negative for blurred vision.  Respiratory:  Positive for shortness of breath. Negative for cough, hemoptysis, sputum production and wheezing.   Cardiovascular:  Positive for leg swelling. Negative for chest pain, palpitations, orthopnea, claudication and PND.  Gastrointestinal:  Negative for abdominal pain, blood in stool, diarrhea, heartburn, melena, nausea and vomiting.  Genitourinary:  Negative for dysuria, frequency and hematuria.  Musculoskeletal:  Positive for joint pain. Negative for myalgias.  Skin:  Negative for itching and rash.  Neurological:  Negative for dizziness, weakness and headaches.  Endo/Heme/Allergies:  Does not bruise/bleed easily.  Psychiatric/Behavioral: Negative.      Objective:   Vitals:   08/28/22 1127  BP: 134/70  Pulse: 91  SpO2: 98%  Weight: 183 lb 3.2 oz (83.1 kg)  Height: 5' 6.5" (1.689 m)   Physical Exam Constitutional:      General: He is not in acute distress.    Appearance: Normal appearance. He is normal weight.  He is not ill-appearing.  HENT:     Head: Normocephalic and  atraumatic.     Nose: Nose normal.     Mouth/Throat:     Mouth: Mucous membranes are moist.     Pharynx: Oropharynx is clear.  Cardiovascular:     Rate and Rhythm: Normal rate and regular rhythm.     Pulses: Normal pulses.     Heart sounds: Murmur (systolic) heard.  Pulmonary:     Effort: Pulmonary effort is normal. No respiratory distress.     Breath sounds: Examination of the left-middle field reveals rales. Rales (dry, bibasilar) present. No wheezing or rhonchi.  Musculoskeletal:     Right lower leg: Edema present.     Left lower leg: Edema present.  Skin:    General: Skin is warm and dry.     Capillary Refill: Capillary refill takes less than 2 seconds.  Neurological:     General: No focal deficit present.     Mental Status: He is alert and oriented to person, place, and time. Mental status is at baseline.     Gait: Gait normal.     CBC    Component Value Date/Time   WBC 7.4 06/29/2018 0247   RBC 3.79 (L) 06/29/2018 0247   HGB 11.6 (L) 06/29/2018 0247   HCT 34.9 (L) 06/29/2018 0247   PLT 237 06/29/2018 0247   MCV 92.1 06/29/2018 0247   MCH 30.6 06/29/2018 0247   MCHC 33.2 06/29/2018 0247   RDW 13.4 06/29/2018 0247   Chest imaging: HRCT Chest 08/22/22 1. Mild pulmonary fibrosis in a pattern with slight apical to basal gradient, featuring irregular peripheral interstitial opacity, septal thickening, traction bronchiectasis, and small areas of subpleural bronchiolectasis at the lung bases without clear evidence of honeycombing. Fibrotic findings are not appreciably changed in comparison to most recent prior examination dated 10/18/2021, however as previously reported are clearly worsened over a longer period of time. 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 [place],  Iss 5, [phone], Jun 13 2017. 2. Minimal emphysema. 3. Coronary artery disease. 4. Aortic valve calcifications. Correlate for echocardiographic evidence of aortic valve dysfunction. 5. Hepatic steatosis.  HRCT Chest 10/18/21 1. Progressively worsening interstitial lung disease with imaging findings considered probable usual interstitial pneumonia (UIP) per current ATS guidelines. Given the progression compared to the prior study, UIP is strongly favored. 2. Aortic atherosclerosis, in addition to left main and three-vessel coronary artery disease. Assessment for potential risk factor modification, dietary therapy or pharmacologic therapy may be warranted, if clinically indicated. 3. There are calcifications of the aortic valve and mitral annulus. Echocardiographic correlation for evaluation of potential valvular dysfunction may be warranted if clinically indicated. 4. Mild hepatic steatosis.  09/14/20 HRCT Chest Mediastinum/Nodes: No pathologically enlarged mediastinal or hilar lymph nodes. Please note that accurate exclusion of hilar adenopathy is limited on noncontrast CT scans. Esophagus is unremarkable in appearance. No axillary lymphadenopathy.   Lungs/Pleura: High-resolution images demonstrate patchy areas of peripheral predominant ground-glass attenuation, septal thickening, subpleural reticulation, parenchymal banding, cylindrical bronchiectasis and peripheral bronchiolectasis. No definitive honeycombing identified. Inspiratory and expiratory imaging is unremarkable. No acute consolidative airspace disease. No pleural effusions. Calcified granuloma in the right middle lobe. Previously noted 3 mm right upper lobe nodule has completely resolved. No definite suspicious appearing pulmonary nodules or masses are noted.  03/02/20 CT Chest: 1. Bilateral  peripheral predominant patchy areas of ground-glass  attenuation, interstitial thickening and reticulation with mild  traction  bronchiectasis. Findings compatible with interstitial lung  disease, favor hypersensitivity pneumonitis. Cannot rule out drug  toxicity. No frank honeycombing identified at this point. Recommend  follow-up imaging in 6 months with high-resolution CT of the chest  to assess for any temporal changes in the appearance of the lungs.  2. 3 mm right upper lobe lung nodule. No follow-up needed if patient  is low-risk. Non-contrast chest CT can be considered in 12 months if  patient is high-risk.  3. 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  4. Aortic atherosclerosis, in addition to 3 vessel coronary artery  disease. Please note that although the presence of coronary artery  calcium documents the presence of coronary artery disease, the  severity of this disease and any potential stenosis cannot be  assessed on this non-gated CT examination. Assessment for potential  risk factor modification, dietary therapy or pharmacologic therapy  may be warranted, if clinically indicated.   CXR 03/27/20 Bilateral interstitial prominence again noted. Interstitial changes  are less prominent than noted on prior CT of 03/02/2020  CXR 04/15/20 Lungs are hypoinflated with  stable elevation of the left hemidiaphragm. There is interval  worsening of patchy bilateral hazy mixed interstitial airspace  density. Note that a patchy bilateral ground-glass process was noted  on the recent previous CT. Linear atelectasis over the left  perihilar region. No effusion. Cardiomediastinal silhouette is  normal. Remainder of the exam is unchanged.   PFT: 2022: FEV1/FVC 87%, FEV1 2.06L (80%), FVC 2.37L (66%) and DLCO 15.56 (69%)  2021: FEV1/FVC 88, FEV1 2.22L (86%), FVC 2.53L (70%), TLC 5.2L (82%), DLCO 14.99 (67%)   Labs: 06/21/20 CBC: WBC 47.9, HGB 12, HCT 38.2, Plt 895 06/21/20 BMP: Na 135, K 3.9, Cl 99, Co2  26.8, Cr 1.56, BUN 18   08/02/20 CBC: WBC 32.2, HGB 11.7, HCT 36.3, PLT 983 08/02/20 BMP: Na 133, K 4.6, Cl 95, Co2 29.7, Cr 1.72, BUN 8   Echo: 05/2022 LV EF 70-75%. RV size and systolic function is normal. Moderate mitral stenosis. Moderate to severe mitral annular calcification. Moderate calcification of the aortic valve. Mild to moderate aortic stenosis. Peak gradient 26mHg.    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  He continues to have progressive dyspnea and fatigue. His HRCT Chest and PFTs remainly fairly stable without significant progression.   We will check lower extremity [place] lower extremity edema.   Review of his case at ILD conference feels most of his pulmonary findings are likely related to drug pneumonitis rather than progressive ILD at this time.   Follow up in 4 months.   JFreda Jackson MD [facility] Office: [phone]   Current Outpatient Medications:    acetaminophen (TYLENOL) 500 MG tablet, Take 1,000 mg by mouth every 6 (six) hours as needed for moderate pain., Disp: , Rfl:    amLODipine (NORVASC) 5 MG tablet, Take 1 tablet (5 mg total) by mouth  daily., Disp: 90 tablet, Rfl: 1   aspirin EC 81 MG tablet, Take 162 mg by mouth daily., Disp: , Rfl:    atorvastatin (LIPITOR) 10 MG tablet, Take 10 mg by mouth at bedtime. , Disp: , Rfl:    Cholecalciferol (VITAMIN D3) 5000 units TABS, Take 5,000 Units by mouth 3 (three) times a week., Disp: , Rfl:    clopidogrel (PLAVIX) 75 MG tablet, Take 75 mg by  mouth daily., Disp: , Rfl:    cyanocobalamin 1000 MCG tablet, Take 3,000 mcg by mouth daily., Disp: , Rfl:    cyclobenzaprine (FLEXERIL) 10 MG tablet, Take 10 mg by mouth 2 (two) times daily., Disp: , Rfl:    ferrous sulfate 325 (65 FE) MG tablet, Take 1 tablet by mouth daily., Disp: , Rfl:    fluconazole (DIFLUCAN) 150 MG tablet, Take 150 mg by mouth daily., Disp: , Rfl:    folic acid (FOLVITE) 1 MG tablet, Take 1 mg by mouth daily., Disp: , Rfl:    furosemide (LASIX) 20 MG tablet, Take 20 mg by mouth daily as needed for fluid., Disp: , Rfl:    gabapentin (NEURONTIN) 600 MG tablet, Take 600 mg by mouth 3 (three) times daily., Disp: , Rfl:    imatinib (GLEEVEC) 100 MG tablet, Take 300 mg by mouth daily at 6 (six) AM., Disp: , Rfl:    losartan-hydrochlorothiazide (HYZAAR) 100-12.5 MG tablet, Take 1 tablet by mouth daily., Disp: , Rfl:    magic mouthwash SOLN, Take 5 mLs by mouth daily as needed for mouth pain., Disp: , Rfl:    nitroGLYCERIN (NITROSTAT) 0.4 MG SL tablet, Place 1 tablet (0.4 mg total) under the tongue every 5 (five) minutes as needed for chest pain., Disp: 30 tablet, Rfl: 5   Omega-3 Fatty Acids (FISH OIL) 1000 MG CAPS, Take 2,000 mg by mouth 2 (two) times daily. , Disp: , Rfl:    ondansetron (ZOFRAN) 8 MG tablet, Take 8 mg by mouth every 8 (eight) hours as needed for vomiting or nausea., Disp: , Rfl:    pantoprazole (PROTONIX) 40 MG tablet, Take 40 mg by mouth daily., Disp: , Rfl:    PHOS-NAK 280-160-250 MG PACK, Take 1 packet by mouth 2 (two) times daily., Disp: , Rfl:    polyethylene glycol (MIRALAX / GLYCOLAX) packet, Take 17 g by mouth daily., Disp: , Rfl:    predniSONE (DELTASONE) 5 MG tablet, Take 5 mg by mouth daily., Disp: , Rfl:    tamsulosin (FLOMAX) 0.4 MG CAPS capsule, Take 0.4 mg by mouth daily., Disp: , Rfl:    terbinafine (LAMISIL) 1 % cream, Apply 1 application topically 2 (two) times daily., Disp: , Rfl:    valACYclovir (VALTREX) 1000 MG tablet, Take 1,000 mg by  mouth daily., Disp: , Rfl:

## 2022-08-29 ENCOUNTER — Ambulatory Visit (HOSPITAL_COMMUNITY)
Admission: RE | Admit: 2022-08-29 | Discharge: 2022-08-29 | Disposition: A | Discharge status: Home / Self Care | Payer: Medicare Other | Source: Ambulatory Visit | Attending: Pulmonary Disease | Admitting: Pulmonary Disease

## 2022-08-29 DIAGNOSIS — M7989 Other specified soft tissue disorders: Secondary | ICD-10-CM | POA: Insufficient documentation

## 2022-09-01 ENCOUNTER — Telehealth: Payer: Self-pay | Admitting: *Deleted

## 2022-09-01 ENCOUNTER — Other Ambulatory Visit: Payer: Self-pay

## 2022-09-01 DIAGNOSIS — R0609 Other forms of dyspnea: Secondary | ICD-10-CM

## 2022-09-01 DIAGNOSIS — G4734 Idiopathic sleep related nonobstructive alveolar hypoventilation: Secondary | ICD-10-CM

## 2022-09-01 NOTE — Telephone Encounter (Signed)
Patient called and states he needs an oxygen prescription. Please call and confirm [phone]

## 2022-09-01 NOTE — Telephone Encounter (Signed)
Called and left voicemail for patient to call office back. I need to know what oxygen he is needing. At the start of the month Dr Erin Fulling suggested 2L for night time use based off of ONO.

## 2022-09-05 ENCOUNTER — Encounter: Payer: Self-pay | Admitting: Pulmonary Disease

## 2022-09-05 ENCOUNTER — Telehealth: Payer: Self-pay | Admitting: Pulmonary Disease

## 2022-09-05 NOTE — Telephone Encounter (Signed)
Pt called stating he now wanted to set up his O2 for night use (after refusing at first). ONO was completed at sometime in October which I believe is outside the 30 day window for [place] to O2. Dr. Erin Fulling please advise on if pt needs another ONO. Also pt states he was supposed to walk at last OV to see if he needed O2 during the day. Please advise on this as well Dr. Erin Fulling.

## 2022-09-14 NOTE — Telephone Encounter (Signed)
Please order patient for another ONO and a time to come in for simple walk to check for ambulatory oxygen needs.  Thanks, Wille Glaser

## 2022-09-15 NOTE — Telephone Encounter (Signed)
Called and spoke with pt letting him know the info per Dr. Erin Fulling.  As soon as I stated to pt that we were going to need to order another ONO, pt immediately said, "Just forget it." And hung up the phone.

## 2022-09-24 NOTE — Progress Notes (Signed)
Interstitial Lung Disease Multidisciplinary Conference   Lonnie Duffy    MRN [id number]    DOB 05-06-1941  Primary Care Physician:Daniel, Mitzie Na, MD  Referring Physician: Dr. Freda Jackson  Time of Conference: 7.00am- 8.00am Date of conference: 08/26/2022 Location of Conference: -  Virtual  Participating Pulmonary: Dr. Brand Males,  Dr Marshell Garfinkel Pathology:   Radiology: Dr Yetta Glassman  Others: Dr. Erskine Emery, Dr. Roselie Awkward, Dr. 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  If this is indeed UIP in setting of RA, would we recommend cell cept + anti-fibrotic therapy?   PFT    Latest Ref Rng & Units 08/28/2022    9:47 AM 05/24/2021    9:33 AM 08/21/2020   12:52 PM  PFT Results  FVC-Pre L 2.38  2.37  2.43   FVC-Predicted Pre % 68  66  67   FVC-Post L 2.37   2.53   FVC-Predicted Post % 67   70   Pre FEV1/FVC % % 88  87  85   Post FEV1/FCV % % 89   88   FEV1-Pre L 2.09  2.06  2.06   FEV1-Predicted Pre % 84  80  80   FEV1-Post L 2.12   2.22   DLCO uncorrected ml/min/mmHg 13.89  15.56  14.99   DLCO UNC% % 63  69  67   DLCO corrected ml/min/mmHg 16.78  15.56  14.99   DLCO COR %Predicted % 76  69  67   DLVA Predicted % 99  93  91   TLC L 4.71   5.20   TLC % Predicted % 74   82   RV % Predicted % 91   76       MDD discussion of CT scan    - Date or time period of scan:  HRCT: 08/22/2022 HRCT: 10/18/2021 HRCT: 09/14/2020  - Discussion synopsis:  Subpleural retic +. Traction bronchiectsis +. Crani-caudal grandient +. No Honeycombing.    - What is the final conclusion per 2018 ATS/Fleischner Criteria -  PROB UIP.   - Concordance with official report:  Since 2021 ->maybe slight progression in the upper lobes.  Pathology discussion of  biopsy: no biopsy to discuss    MDD Impression/Recs:  rx - immune suppressant for sureand if progressive add ofev    Time Spent in preparation and discussion:  > 30 min    SIGNATURE   Dr. Brand Males, M.D., F.C.C.P,  Pulmonary and Critical Care Medicine Staff Physician, [place] Director - Interstitial Lung Disease  Program  [facility] [place] [facility] [place], [place], [address]  Pager: [phone], If no answer or between  15:00h - 7:00h: call [phone] Telephone: [phone]  2:13 PM 09/24/2022 ...................................................................................................................Marland Kitchen References: Diagnosis of Hypersensitivity Pneumonitis in Adults. An Official ATS/JRS/ALAT Clinical Practice Guideline. Ragu G et al, [place] Aug 1;202(3):e36-e69.       Diagnosis of Idiopathic Pulmonary Fibrosis. An Official ATS/ERS/JRS/ALAT Clinical Practice Guideline. Raghu G et al, [place]. 2018 Sep 1;198(5):e44-e68.   IPF Suspected   Histopath ology Pattern      UIP  Probable UIP  Indeterminate for  UIP  Alternative  diagnosis    UIP  IPF  IPF  IPF  Non-IPF dx   HRCT   Probabe UIP  IPF  IPF  IPF (Likely)**  Non-IPF dx  Pattern  Indeterminate for UIP  IPF  IPF (Likely)**  Indeterminate  for IPF**  Non-IPF dx    Alternative diagnosis  IPF (Likely)**/ non-IPF dx  Non-IPF dx  Non-IPF dx  Non-IPF dx     Idiopathic pulmonary fibrosis diagnosis based upon HRCT and Biopsy paterns.  ** IPF is the likely diagnosis when any of following features are present:  Moderate-to-severe traction bronchiectasis/bronchiolectasis (defined as mild traction bronchiectasis/bronchiolectasis in four or more lobes including the lingual as a lobe, or moderate to severe traction bronchiectasis in two or more lobes) in a man over age 54 years or in  a woman over age 79 years Extensive (>30%) reticulation on HRCT and an age >70 years  Increased neutrophils and/or absence of lymphocytosis in BAL fluid  Multidisciplinary discussion reaches a confident diagnosis of IPF.   **Indeterminate for IPF  Without an adequate biopsy is unlikely to be IPF  With an adequate biopsy may be reclassified to a more specific diagnosis after multidisciplinary discussion and/or additional consultation.   dx = diagnosis; HRCT = high-resolution computed tomography; IPF = idiopathic pulmonary fibrosis; UIP = usual interstitial pneumonia.

## 2022-11-13 DEATH — deceased

## 2022-12-23 ENCOUNTER — Ambulatory Visit: Payer: Medicare Other | Admitting: Cardiology
